# Patient Record
Sex: Male | Born: 1965
Health system: Southern US, Community
[De-identification: ages and names within clinical notes are randomized; demographics above are authoritative.]

## PROBLEM LIST (undated history)

## (undated) DIAGNOSIS — K219 Gastro-esophageal reflux disease without esophagitis: Secondary | ICD-10-CM

## (undated) DIAGNOSIS — I10 Essential (primary) hypertension: Secondary | ICD-10-CM

## (undated) DIAGNOSIS — F419 Anxiety disorder, unspecified: Secondary | ICD-10-CM

## (undated) DIAGNOSIS — R569 Unspecified convulsions: Secondary | ICD-10-CM

## (undated) HISTORY — DX: Gastro-esophageal reflux disease without esophagitis: K21.9

## (undated) HISTORY — DX: Anxiety disorder, unspecified: F41.9

## (undated) HISTORY — DX: Unspecified convulsions: R56.9

---

## 2000-10-18 ENCOUNTER — Emergency Department (HOSPITAL_COMMUNITY): Admission: EM | Admit: 2000-10-18 | Discharge: 2000-10-18 | Payer: Self-pay | Admitting: Emergency Medicine

## 2000-11-22 ENCOUNTER — Emergency Department (HOSPITAL_COMMUNITY): Admission: EM | Admit: 2000-11-22 | Discharge: 2000-11-22 | Payer: Self-pay | Admitting: Emergency Medicine

## 2003-08-12 ENCOUNTER — Emergency Department (HOSPITAL_COMMUNITY): Admission: EM | Admit: 2003-08-12 | Discharge: 2003-08-12 | Payer: Self-pay | Admitting: Emergency Medicine

## 2018-12-17 ENCOUNTER — Emergency Department (HOSPITAL_COMMUNITY)

## 2018-12-17 ENCOUNTER — Other Ambulatory Visit: Payer: Self-pay

## 2018-12-17 ENCOUNTER — Encounter (HOSPITAL_COMMUNITY): Payer: Self-pay | Admitting: Emergency Medicine

## 2018-12-17 ENCOUNTER — Inpatient Hospital Stay (HOSPITAL_COMMUNITY)
Admission: EM | Admit: 2018-12-17 | Discharge: 2018-12-25 | DRG: 178 | Disposition: A | Discharge status: Home / Self Care | Attending: Family Medicine | Admitting: Family Medicine

## 2018-12-17 DIAGNOSIS — Z20828 Contact with and (suspected) exposure to other viral communicable diseases: Secondary | ICD-10-CM | POA: Diagnosis present

## 2018-12-17 DIAGNOSIS — B1920 Unspecified viral hepatitis C without hepatic coma: Secondary | ICD-10-CM | POA: Diagnosis present

## 2018-12-17 DIAGNOSIS — R296 Repeated falls: Secondary | ICD-10-CM | POA: Diagnosis present

## 2018-12-17 DIAGNOSIS — E871 Hypo-osmolality and hyponatremia: Secondary | ICD-10-CM | POA: Diagnosis present

## 2018-12-17 DIAGNOSIS — J852 Abscess of lung without pneumonia: Principal | ICD-10-CM | POA: Diagnosis present

## 2018-12-17 DIAGNOSIS — J984 Other disorders of lung: Secondary | ICD-10-CM

## 2018-12-17 DIAGNOSIS — E876 Hypokalemia: Secondary | ICD-10-CM | POA: Diagnosis present

## 2018-12-17 DIAGNOSIS — R410 Disorientation, unspecified: Secondary | ICD-10-CM | POA: Diagnosis present

## 2018-12-17 DIAGNOSIS — R2681 Unsteadiness on feet: Secondary | ICD-10-CM | POA: Diagnosis present

## 2018-12-17 DIAGNOSIS — I10 Essential (primary) hypertension: Secondary | ICD-10-CM | POA: Diagnosis present

## 2018-12-17 DIAGNOSIS — R945 Abnormal results of liver function studies: Secondary | ICD-10-CM | POA: Diagnosis present

## 2018-12-17 DIAGNOSIS — N179 Acute kidney failure, unspecified: Secondary | ICD-10-CM | POA: Diagnosis present

## 2018-12-17 DIAGNOSIS — F1020 Alcohol dependence, uncomplicated: Secondary | ICD-10-CM | POA: Diagnosis present

## 2018-12-17 HISTORY — DX: Essential (primary) hypertension: I10

## 2018-12-17 LAB — CBC WITH DIFFERENTIAL/PLATELET
Abs Immature Granulocytes: 0.07 10*3/uL (ref 0.00–0.07)
Basophils Absolute: 0.1 10*3/uL (ref 0.0–0.1)
Basophils Relative: 1 %
Eosinophils Absolute: 0.2 10*3/uL (ref 0.0–0.5)
Eosinophils Relative: 2 %
HCT: 32.7 % — ABNORMAL LOW (ref 39.0–52.0)
Hemoglobin: 10.2 g/dL — ABNORMAL LOW (ref 13.0–17.0)
Immature Granulocytes: 1 %
Lymphocytes Relative: 20 %
Lymphs Abs: 2.8 10*3/uL (ref 0.7–4.0)
MCH: 30.8 pg (ref 26.0–34.0)
MCHC: 31.2 g/dL (ref 30.0–36.0)
MCV: 98.8 fL (ref 80.0–100.0)
Monocytes Absolute: 1.3 10*3/uL — ABNORMAL HIGH (ref 0.1–1.0)
Monocytes Relative: 10 %
Neutro Abs: 9.2 10*3/uL — ABNORMAL HIGH (ref 1.7–7.7)
Neutrophils Relative %: 66 %
Platelets: 370 10*3/uL (ref 150–400)
RBC: 3.31 MIL/uL — ABNORMAL LOW (ref 4.22–5.81)
RDW: 13.5 % (ref 11.5–15.5)
WBC: 13.6 10*3/uL — ABNORMAL HIGH (ref 4.0–10.5)
nRBC: 0 % (ref 0.0–0.2)

## 2018-12-17 LAB — AMMONIA: Ammonia: 20 umol/L (ref 9–35)

## 2018-12-17 NOTE — ED Provider Notes (Signed)
[facility] EMERGENCY DEPARTMENT Provider Note   CSN: [id number] Arrival date & time: 12/17/18  1520     History   Chief Complaint Chief Complaint  Patient presents with   Altered Mental Status    HPI Jared Pope is a 53 y.o. male with a history of HTN and etoh abuse,  Presenting from the local [place] jail where he has been since 12/02/18. His presenting paperwork states that he completed etoh withdrawal protocol upon entering their facility but have noticed him to be having increased confusion, frequent falls and being unsteady on his feet.  Pt states he gets dizzy when he stands too fast.  When patient is asked how much he was drinking, he reports 4-5 beers per day and he "detoxed himself" in jail.  He denies chest pain, sob, headache, n/v, abdominal pain.  He also reports decreased appetite and weight loss but is unable to quantify.     HPI  Past Medical History:  Diagnosis Date   Hypertension     There are no active problems to display for this patient.   History reviewed. No pertinent surgical history.      Home Medications    Prior to Admission medications   Not on File    Family History No family history on file.  Social History Social History   Tobacco Use   Smoking status: Never Smoker   Smokeless tobacco: Never Used  Substance Use Topics   Alcohol use: Yes   Drug use: Not Currently     Allergies   Patient has no allergy information on record.   Review of Systems Review of Systems  Constitutional: Negative for chills and fever.  HENT: Negative for congestion.   Eyes: Negative.   Respiratory: Negative for chest tightness and shortness of breath.   Cardiovascular: Negative for chest pain.  Gastrointestinal: Negative for abdominal pain, nausea and vomiting.  Genitourinary: Negative.   Musculoskeletal: Negative for arthralgias, joint swelling and neck pain.  Skin: Negative.  Negative for rash and wound.  Neurological:  Positive for weakness and light-headedness. Negative for dizziness, numbness and headaches.  Psychiatric/Behavioral: Positive for confusion. Negative for agitation.     Physical Exam Updated Vital Signs BP 106/79    Pulse (!) 116    Temp 99.2 F (37.3 C) (Oral)    Resp 16    Ht 5\' 7"  (1.702 m)    Wt 66.7 kg    SpO2 98%    BMI 23.02 kg/m   Physical Exam Vitals signs and nursing note reviewed.  Constitutional:      Appearance: He is well-developed.  HENT:     Head: Normocephalic and atraumatic.     Mouth/Throat:     Mouth: Mucous membranes are moist.  Eyes:     Extraocular Movements: Extraocular movements intact.     Conjunctiva/sclera: Conjunctivae normal.     Pupils: Pupils are equal, round, and reactive to light.  Neck:     Musculoskeletal: Normal range of motion. No neck rigidity.  Cardiovascular:     Rate and Rhythm: Regular rhythm. Tachycardia present.     Heart sounds: Normal heart sounds.  Pulmonary:     Effort: Pulmonary effort is normal.     Breath sounds: Normal breath sounds. No wheezing.  Abdominal:     General: Bowel sounds are normal. There is no distension.     Palpations: Abdomen is soft.     Tenderness: There is no abdominal tenderness. There is no guarding.  Musculoskeletal:  Normal range of motion.  Skin:    General: Skin is warm and dry.  Neurological:     Mental Status: He is alert and oriented to person, place, and time.     Cranial Nerves: Cranial nerves are intact. No cranial nerve deficit.     Sensory: No sensory deficit.     Motor: Tremor present. No seizure activity.     Coordination: Heel to [facility] Test normal. Rapid alternating movements normal.     Comments: Equal grip strength.  RAM completed but slow.      ED Treatments / Results  Labs (all labs ordered are listed, but only abnormal results are displayed) Labs Reviewed  COMPREHENSIVE METABOLIC PANEL - Abnormal; Notable for the following components:      Result Value   Sodium 133 (*)      Chloride 96 (*)    Glucose, Bld 102 (*)    BUN 29 (*)    Creatinine, Ser 1.31 (*)    Total Protein 9.6 (*)    Albumin 3.1 (*)    AST 84 (*)    ALT 48 (*)    Total Bilirubin 1.7 (*)    All other components within normal limits  CBC WITH DIFFERENTIAL/PLATELET - Abnormal; Notable for the following components:   WBC 13.6 (*)    RBC 3.31 (*)    Hemoglobin 10.2 (*)    HCT 32.7 (*)    Neutro Abs 9.2 (*)    Monocytes Absolute 1.3 (*)    All other components within normal limits  AMMONIA  URINALYSIS, ROUTINE W REFLEX MICROSCOPIC  RAPID URINE DRUG SCREEN, HOSP PERFORMED  ETHANOL  TROPONIN I (HIGH SENSITIVITY)  TROPONIN I (HIGH SENSITIVITY)    EKG EKG Interpretation  Date/Time:  Thursday December 18 2018 00:10:04 EDT Ventricular Rate:  99 PR Interval:    QRS Duration: 91 QT Interval:  375 QTC Calculation: 482 R Axis:   73 Text Interpretation:  Normal sinus rhythm Borderline prolonged QT interval Baseline wander in lead(s) V1 Confirmed by Gilda Crease [phone]) on 12/18/2018 12:25:08 AM   Radiology Ct Head Wo Contrast  Result Date: 12/17/2018 CLINICAL DATA:  Generalized muscle weakness. Altered mental status. Dizziness. EXAM: CT HEAD WITHOUT CONTRAST TECHNIQUE: Contiguous axial images were obtained from the base of the skull through the vertex without intravenous contrast. COMPARISON:  Head CT 12/17/2016 FINDINGS: Brain: No evidence of acute infarction, hemorrhage, hydrocephalus, extra-axial collection or mass lesion/mass effect. Mild generalized atrophy. Mild chronic small vessel ischemia. 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 Vascular: No hyperdense vessel or unexpected calcification. Skull: No fracture or focal lesion. Sinuses/Orbits: Paranasal sinuses and mastoid air cells are clear. The visualized orbits are unremarkable. Other: Chronic right parietal scalp scarring. IMPRESSION: 1. No acute intracranial abnormality. 2. Mild  generalized atrophy and chronic small vessel ischemia, slightly advanced for age. Electronically Signed   By: Narda Rutherford M.D.   On: 12/17/2018 23:12    Procedures Procedures (including critical care time)  Medications Ordered in ED Medications  sodium chloride 0.9 % bolus 1,000 mL (has no administration in time range)  thiamine (B-1) injection 100 mg (has no administration in time range)     Initial Impression / Assessment and Plan / ED Course  I have reviewed the triage vital signs and the nursing notes.  Pertinent labs & imaging results that were available during my care of the patient were reviewed by me and considered in my medical decision making (see  chart for details).        Pt who has been incarcerated for 16 days, prior has been a heavy etoh abuser, increased confusion, falls, lightheadedness.  Pending labs at this time, but some resulting labs suggesting dehydration with elevation in creatinine and BUN, IV fluids and thiamine ordered.    Discussed with Dr. Betsey Holiday who will dispo pt when labs completed.   Final Clinical Impressions(s) / ED Diagnoses   Final diagnoses:  None    ED Discharge Orders    None       Landis Martins 12/18/18 0043    Orpah Greek, MD 12/18/18 [phone]

## 2018-12-17 NOTE — ED Triage Notes (Signed)
Pt sent from the jail for ams dizziness and unsteady on his feet.

## 2018-12-18 ENCOUNTER — Inpatient Hospital Stay (HOSPITAL_COMMUNITY)

## 2018-12-18 ENCOUNTER — Emergency Department (HOSPITAL_COMMUNITY)

## 2018-12-18 ENCOUNTER — Encounter (HOSPITAL_COMMUNITY): Payer: Self-pay | Admitting: Internal Medicine

## 2018-12-18 DIAGNOSIS — J984 Other disorders of lung: Secondary | ICD-10-CM

## 2018-12-18 DIAGNOSIS — E871 Hypo-osmolality and hyponatremia: Secondary | ICD-10-CM

## 2018-12-18 DIAGNOSIS — R945 Abnormal results of liver function studies: Secondary | ICD-10-CM | POA: Diagnosis present

## 2018-12-18 DIAGNOSIS — N179 Acute kidney failure, unspecified: Secondary | ICD-10-CM

## 2018-12-18 LAB — TSH: TSH: 5.286 u[IU]/mL — ABNORMAL HIGH (ref 0.350–4.500)

## 2018-12-18 LAB — CBC
HCT: 26.3 % — ABNORMAL LOW (ref 39.0–52.0)
Hemoglobin: 8.4 g/dL — ABNORMAL LOW (ref 13.0–17.0)
MCH: 31.1 pg (ref 26.0–34.0)
MCHC: 31.9 g/dL (ref 30.0–36.0)
MCV: 97.4 fL (ref 80.0–100.0)
Platelets: 350 10*3/uL (ref 150–400)
RBC: 2.7 MIL/uL — ABNORMAL LOW (ref 4.22–5.81)
RDW: 13.4 % (ref 11.5–15.5)
WBC: 14 10*3/uL — ABNORMAL HIGH (ref 4.0–10.5)
nRBC: 0 % (ref 0.0–0.2)

## 2018-12-18 LAB — COMPREHENSIVE METABOLIC PANEL
ALT: 48 U/L — ABNORMAL HIGH (ref 0–44)
ALT: 41 U/L (ref 0–44)
AST: 84 U/L — ABNORMAL HIGH (ref 15–41)
AST: 73 U/L — ABNORMAL HIGH (ref 15–41)
Albumin: 3.1 g/dL — ABNORMAL LOW (ref 3.5–5.0)
Albumin: 2.6 g/dL — ABNORMAL LOW (ref 3.5–5.0)
Alkaline Phosphatase: 98 U/L (ref 38–126)
Alkaline Phosphatase: 91 U/L (ref 38–126)
Anion gap: 12 (ref 5–15)
Anion gap: 9 (ref 5–15)
BUN: 29 mg/dL — ABNORMAL HIGH (ref 6–20)
BUN: 30 mg/dL — ABNORMAL HIGH (ref 6–20)
CO2: 25 mmol/L (ref 22–32)
CO2: 26 mmol/L (ref 22–32)
Calcium: 8.9 mg/dL (ref 8.9–10.3)
Calcium: 8.1 mg/dL — ABNORMAL LOW (ref 8.9–10.3)
Chloride: 96 mmol/L — ABNORMAL LOW (ref 98–111)
Chloride: 98 mmol/L (ref 98–111)
Creatinine, Ser: 1.31 mg/dL — ABNORMAL HIGH (ref 0.61–1.24)
Creatinine, Ser: 1.17 mg/dL (ref 0.61–1.24)
GFR calc Af Amer: >60 mL/min (ref >60)
GFR calc Af Amer: >60 mL/min (ref >60)
GFR calc non Af Amer: >60 mL/min (ref >60)
GFR calc non Af Amer: >60 mL/min (ref >60)
Glucose, Bld: 102 mg/dL — ABNORMAL HIGH (ref 70–99)
Glucose, Bld: 107 mg/dL — ABNORMAL HIGH (ref 70–99)
Potassium: 3.7 mmol/L (ref 3.5–5.1)
Potassium: 3.6 mmol/L (ref 3.5–5.1)
Sodium: 133 mmol/L — ABNORMAL LOW (ref 135–145)
Sodium: 133 mmol/L — ABNORMAL LOW (ref 135–145)
Total Bilirubin: 1.7 mg/dL — ABNORMAL HIGH (ref 0.3–1.2)
Total Bilirubin: 1.4 mg/dL — ABNORMAL HIGH (ref 0.3–1.2)
Total Protein: 9.6 g/dL — ABNORMAL HIGH (ref 6.5–8.1)
Total Protein: 8.1 g/dL (ref 6.5–8.1)

## 2018-12-18 LAB — MAGNESIUM: Magnesium: 1.3 mg/dL — ABNORMAL LOW (ref 1.7–2.4)

## 2018-12-18 LAB — IRON AND TIBC
Iron: 30 ug/dL — ABNORMAL LOW (ref 45–182)
Saturation Ratios: 11 % — ABNORMAL LOW (ref 17.9–39.5)
TIBC: 280 ug/dL (ref 250–450)
UIBC: 250 ug/dL

## 2018-12-18 LAB — RAPID URINE DRUG SCREEN, HOSP PERFORMED
Amphetamines: NOT DETECTED
Barbiturates: NOT DETECTED
Benzodiazepines: POSITIVE — AB
Cocaine: NOT DETECTED
Opiates: NOT DETECTED
Tetrahydrocannabinol: NOT DETECTED

## 2018-12-18 LAB — URINALYSIS, ROUTINE W REFLEX MICROSCOPIC
Bilirubin Urine: NEGATIVE
Glucose, UA: NEGATIVE mg/dL
Hgb urine dipstick: NEGATIVE
Ketones, ur: NEGATIVE mg/dL
Leukocytes,Ua: NEGATIVE
Nitrite: NEGATIVE
Protein, ur: NEGATIVE mg/dL
Specific Gravity, Urine: 1.033 — ABNORMAL HIGH (ref 1.005–1.030)
pH: 6 (ref 5.0–8.0)

## 2018-12-18 LAB — SARS CORONAVIRUS 2 BY RT PCR (HOSPITAL ORDER, PERFORMED IN ~~LOC~~ HOSPITAL LAB): SARS Coronavirus 2: NEGATIVE

## 2018-12-18 LAB — PHOSPHORUS: Phosphorus: 3 mg/dL (ref 2.5–4.6)

## 2018-12-18 LAB — ETHANOL: Alcohol, Ethyl (B): <10 mg/dL (ref <10)

## 2018-12-18 LAB — TROPONIN I (HIGH SENSITIVITY)
Troponin I (High Sensitivity): 5 ng/L (ref <18)
Troponin I (High Sensitivity): 6 ng/L (ref <18)

## 2018-12-18 LAB — VITAMIN B12: Vitamin B-12: 2605 pg/mL — ABNORMAL HIGH (ref 180–914)

## 2018-12-18 LAB — FERRITIN: Ferritin: 590 ng/mL — ABNORMAL HIGH (ref 24–336)

## 2018-12-18 LAB — STREP PNEUMONIAE URINARY ANTIGEN: Strep Pneumo Urinary Antigen: NEGATIVE

## 2018-12-18 MED ORDER — POTASSIUM CHLORIDE CRYS ER 20 MEQ PO TBCR
40.0000 meq | EXTENDED_RELEASE_TABLET | Freq: Once | ORAL | Status: AC
  Administered 2018-12-18: 40 meq via ORAL
  Filled 2018-12-18: qty 2

## 2018-12-18 MED ORDER — VITAMIN B-1 100 MG PO TABS
100.0000 mg | ORAL_TABLET | Freq: Every day | ORAL | Status: DC
  Administered 2018-12-19 – 2018-12-25 (×7): 100 mg via ORAL
  Filled 2018-12-18 (×8): qty 1

## 2018-12-18 MED ORDER — FOLIC ACID 1 MG PO TABS
1.0000 mg | ORAL_TABLET | Freq: Every day | ORAL | Status: DC
  Administered 2018-12-18 – 2018-12-25 (×8): 1 mg via ORAL
  Filled 2018-12-18 (×8): qty 1

## 2018-12-18 MED ORDER — LORAZEPAM 1 MG PO TABS: 1.0000 mg | ORAL_TABLET | ORAL | Status: AC | PRN

## 2018-12-18 MED ORDER — THIAMINE HCL 100 MG/ML IJ SOLN
100.0000 mg | Freq: Every day | INTRAMUSCULAR | Status: DC
  Administered 2018-12-18: 100 mg via INTRAVENOUS
  Filled 2018-12-18 (×2): qty 2

## 2018-12-18 MED ORDER — ACETAMINOPHEN 650 MG RE SUPP: 650.0000 mg | Freq: Four times a day (QID) | RECTAL | Status: DC | PRN

## 2018-12-18 MED ORDER — LORAZEPAM 2 MG/ML IJ SOLN: 1.0000 mg | INTRAMUSCULAR | Status: AC | PRN

## 2018-12-18 MED ORDER — SODIUM CHLORIDE 0.9 % IV SOLN
INTRAVENOUS | Status: AC
  Administered 2018-12-18: 06:00:00 via INTRAVENOUS

## 2018-12-18 MED ORDER — IOHEXOL 300 MG/ML  SOLN
75.0000 mL | Freq: Once | INTRAMUSCULAR | Status: AC | PRN
  Administered 2018-12-18: 75 mL via INTRAVENOUS

## 2018-12-18 MED ORDER — LORAZEPAM 2 MG/ML IJ SOLN: 0.0000 mg | Freq: Four times a day (QID) | INTRAMUSCULAR | Status: AC

## 2018-12-18 MED ORDER — THIAMINE HCL 100 MG/ML IJ SOLN
100.0000 mg | Freq: Once | INTRAMUSCULAR | Status: AC
  Administered 2018-12-18: 100 mg via INTRAVENOUS
  Filled 2018-12-18: qty 2

## 2018-12-18 MED ORDER — SODIUM CHLORIDE 0.9 % IV BOLUS
1000.0000 mL | Freq: Once | INTRAVENOUS | Status: AC
  Administered 2018-12-18: 1000 mL via INTRAVENOUS

## 2018-12-18 MED ORDER — ENOXAPARIN SODIUM 40 MG/0.4ML ~~LOC~~ SOLN
40.0000 mg | SUBCUTANEOUS | Status: DC
  Administered 2018-12-18 – 2018-12-25 (×8): 40 mg via SUBCUTANEOUS
  Filled 2018-12-18 (×9): qty 0.4

## 2018-12-18 MED ORDER — PRO-STAT SUGAR FREE PO LIQD
30.0000 mL | Freq: Two times a day (BID) | ORAL | Status: DC
  Administered 2018-12-18 – 2018-12-25 (×14): 30 mL via ORAL
  Filled 2018-12-18 (×18): qty 30

## 2018-12-18 MED ORDER — ACETAMINOPHEN 325 MG PO TABS
650.0000 mg | ORAL_TABLET | Freq: Four times a day (QID) | ORAL | Status: DC | PRN
  Administered 2018-12-18 – 2018-12-20 (×3): 650 mg via ORAL
  Filled 2018-12-18 (×4): qty 2

## 2018-12-18 MED ORDER — SODIUM CHLORIDE 0.9 % IV SOLN
3.0000 g | Freq: Four times a day (QID) | INTRAVENOUS | Status: DC
  Administered 2018-12-18 – 2018-12-23 (×21): 3 g via INTRAVENOUS
  Filled 2018-12-18: qty 8
  Filled 2018-12-18 (×3): qty 3
  Filled 2018-12-18: qty 8
  Filled 2018-12-18 (×2): qty 3
  Filled 2018-12-18 (×11): qty 8
  Filled 2018-12-18: qty 3
  Filled 2018-12-18: qty 8
  Filled 2018-12-18: qty 3
  Filled 2018-12-18 (×6): qty 8

## 2018-12-18 MED ORDER — MAGNESIUM SULFATE 2 GM/50ML IV SOLN
2.0000 g | Freq: Once | INTRAVENOUS | Status: AC
  Administered 2018-12-18: 2 g via INTRAVENOUS
  Filled 2018-12-18: qty 50

## 2018-12-18 MED ORDER — ADULT MULTIVITAMIN W/MINERALS CH
1.0000 | ORAL_TABLET | Freq: Every day | ORAL | Status: DC
  Administered 2018-12-18 – 2018-12-25 (×8): 1 via ORAL
  Filled 2018-12-18 (×8): qty 1

## 2018-12-18 MED ORDER — LORAZEPAM 2 MG/ML IJ SOLN: 0.0000 mg | Freq: Two times a day (BID) | INTRAMUSCULAR | Status: AC

## 2018-12-18 NOTE — ED Notes (Signed)
Patient given lunch tray, afternoon meds, placed back on monitor.

## 2018-12-18 NOTE — Consult Note (Signed)
Consult requested by: [facility] hospitalist, Dr.Ghimire Consult requested for: Cavitary lung lesion  HPI: This is a 53 year old who was brought from the [facility] jail where he has been since September 8.  He is apparently had confusion frequent falls been unsteady on his feet.  He apparently was sent to the emergency department for questionable detox.  He has significant history of alcohol abuse.  He is not able to provide any significant history.  But does say that he had a cough as part of his work-up he had a CT of the brain and had CT of the chest and CT of the chest shows a cavitary lesion in the left upper lobe.  I have personally reviewed this scan.  Past Medical History:  Diagnosis Date  . Hypertension      Family History  Family history unknown: Yes    Unable to obtain Social History   Socioeconomic History  . Marital status: Single    Spouse name: Not on file  . Number of children: Not on file  . Years of education: Not on file  . Highest education level: Not on file  Occupational History  . Not on file  Social Needs  . Financial resource strain: Not on file  . Food insecurity    Worry: Not on file    Inability: Not on file  . Transportation needs    Medical: Not on file    Non-medical: Not on file  Tobacco Use  . Smoking status: Never Smoker  . Smokeless tobacco: Never Used  Substance and Sexual Activity  . Alcohol use: Not Currently  . Drug use: Not Currently  . Sexual activity: Not on file  Lifestyle  . Physical activity    Days per week: Not on file    Minutes per session: Not on file  . Stress: Not on file  Relationships  . Social Musician on phone: Not on file    Gets together: Not on file    Attends religious service: Not on file    Active member of club or organization: Not on file    Attends meetings of clubs or organizations: Not on file    Relationship status: Not on file  Other Topics Concern  . Not on file  Social History  Narrative  . Not on file     ROS: Unable to obtain    Objective: Vital signs in last 24 hours: Temp:  [99.2 F (37.3 C)] 99.2 F (37.3 C) (09/23 1526) Pulse Rate:  [89-116] 91 (09/24 0700) Resp:  [16-28] 28 (09/24 0230) BP: (96-106)/(58-79) 98/58 (09/24 0700) SpO2:  [97 %-100 %] 99 % (09/24 0700) Weight:  [66.7 kg] 66.7 kg (09/23 1526) Weight change:     Intake/Output from previous day: No intake/output data recorded.  PHYSICAL EXAM Constitutional: He is thin.  Eyes: Pupils react ears nose mouth and throat: Mucous membranes are dry.  Cardiovascular: His heart is regular with normal heart sounds.  Respiratory: Respiratory effort is normal.  His lungs are pretty clear.  Gastrointestinal: His abdomen is soft with no masses.  Musculoskeletal: Grossly normal strength bilaterally.  Psychiatric: Sluggish.  Neurological: No focal abnormalities.  Lab Results: Basic Metabolic Panel: Recent Labs    12/17/18 2303 12/18/18 0552  NA 133* 133*  K 3.7 3.6  CL 96* 98  CO2 25 26  GLUCOSE 102* 107*  BUN 29* 30*  CREATININE 1.31* 1.17  CALCIUM 8.9 8.1*  MG  --  1.3*  PHOS  --  3.0   Liver Function Tests: Recent Labs    12/17/18 2303 12/18/18 0552  AST 84* 73*  ALT 48* 41  ALKPHOS 98 91  BILITOT 1.7* 1.4*  PROT 9.6* 8.1  ALBUMIN 3.1* 2.6*   No results for input(s): LIPASE, AMYLASE in the last 72 hours. Recent Labs    12/17/18 2303  AMMONIA 20   CBC: Recent Labs    12/17/18 2303 12/18/18 0552  WBC 13.6* 14.0*  NEUTROABS 9.2*  --   HGB 10.2* 8.4*  HCT 32.7* 26.3*  MCV 98.8 97.4  PLT 370 350   Cardiac Enzymes: No results for input(s): CKTOTAL, CKMB, CKMBINDEX, TROPONINI in the last 72 hours. BNP: No results for input(s): PROBNP in the last 72 hours. D-Dimer: No results for input(s): DDIMER in the last 72 hours. CBG: No results for input(s): GLUCAP in the last 72 hours. Hemoglobin A1C: No results for input(s): HGBA1C in the last 72 hours. Fasting Lipid  Panel: No results for input(s): CHOL, HDL, LDLCALC, TRIG, CHOLHDL, LDLDIRECT in the last 72 hours. Thyroid Function Tests: Recent Labs    12/17/18 2303  TSH 5.286*   Anemia Panel: Recent Labs    12/18/18 0549  VITAMINB12 2,605*  FERRITIN 590*  TIBC 280  IRON 30*   Coagulation: No results for input(s): LABPROT, INR in the last 72 hours. Urine Drug Screen: Drugs of Abuse     Component Value Date/Time   LABOPIA NONE DETECTED 12/18/2018 0359   COCAINSCRNUR NONE DETECTED 12/18/2018 0359   LABBENZ POSITIVE (A) 12/18/2018 0359   AMPHETMU NONE DETECTED 12/18/2018 0359   THCU NONE DETECTED 12/18/2018 0359   LABBARB NONE DETECTED 12/18/2018 0359    Alcohol Level: Recent Labs    12/17/18 2303  ETH <10   Urinalysis: Recent Labs    12/18/18 0359  COLORURINE AMBER*  LABSPEC 1.033*  PHURINE 6.0  GLUCOSEU NEGATIVE  HGBUR NEGATIVE  BILIRUBINUR NEGATIVE  KETONESUR NEGATIVE  PROTEINUR NEGATIVE  NITRITE NEGATIVE  LEUKOCYTESUR NEGATIVE   Misc. Labs:   ABGS: No results for input(s): PHART, PO2ART, TCO2, HCO3 in the last 72 hours.  Invalid input(s): PCO2   MICROBIOLOGY: Recent Results (from the past 240 hour(s))  Culture, blood (routine x 2)     Status: None (Preliminary result)   Collection Time: 12/18/18  5:49 AM   Specimen: BLOOD RIGHT ARM  Result Value Ref Range Status   Specimen Description BLOOD RIGHT ARM  Final   Special Requests   Final    BOTTLES DRAWN AEROBIC AND ANAEROBIC Blood Culture adequate volume   Culture   Final    NO GROWTH <12 HOURS Performed at [facility], [address] [place], [place] [address]    Report Status PENDING  Incomplete  Culture, blood (routine x 2)     Status: None (Preliminary result)   Collection Time: 12/18/18  5:52 AM   Specimen: BLOOD RIGHT HAND  Result Value Ref Range Status   Specimen Description BLOOD RIGHT HAND  Final   Special Requests   Final    BOTTLES DRAWN AEROBIC AND ANAEROBIC Blood Culture adequate volume    Culture   Final    NO GROWTH <12 HOURS Performed at [facility], [address], [place], [place] [address]    Report Status PENDING  Incomplete  SARS Coronavirus 2 [facility] order, Performed in [facility] hospital lab) Nasopharyngeal Nasopharyngeal Swab     Status: None   Collection Time: 12/18/18  6:21 AM   Specimen:  Nasopharyngeal Swab  Result Value Ref Range Status   SARS Coronavirus 2 NEGATIVE NEGATIVE Final    Comment: (NOTE) If result is NEGATIVE SARS-CoV-2 target nucleic acids are NOT DETECTED. The SARS-CoV-2 RNA is generally detectable in upper and lower  respiratory specimens during the acute phase of infection. The lowest  concentration of SARS-CoV-2 viral copies this assay can detect is 250  copies / mL. A negative result does not preclude SARS-CoV-2 infection  and should not be used as the sole basis for treatment or other  patient management decisions.  A negative result may occur with  improper specimen collection / handling, submission of specimen other  than nasopharyngeal swab, presence of viral mutation(s) within the  areas targeted by this assay, and inadequate number of viral copies  (<250 copies / mL). A negative result must be combined with clinical  observations, patient history, and epidemiological information. If result is POSITIVE SARS-CoV-2 target nucleic acids are DETECTED. The SARS-CoV-2 RNA is generally detectable in upper and lower  respiratory specimens dur ing the acute phase of infection.  Positive  results are indicative of active infection with SARS-CoV-2.  Clinical  correlation with patient history and other diagnostic information is  necessary to determine patient infection status.  Positive results do  not rule out bacterial infection or co-infection with other viruses. If result is PRESUMPTIVE POSTIVE SARS-CoV-2 nucleic acids MAY BE PRESENT.   A presumptive positive result was obtained on the submitted specimen  and confirmed on repeat  testing.  While 2019 novel coronavirus  (SARS-CoV-2) nucleic acids may be present in the submitted sample  additional confirmatory testing may be necessary for epidemiological  and / or clinical management purposes  to differentiate between  SARS-CoV-2 and other Sarbecovirus currently known to infect humans.  If clinically indicated additional testing with an alternate test  methodology [phone]) is advised. The SARS-CoV-2 RNA is generally  detectable in upper and lower respiratory sp ecimens during the acute  phase of infection. The expected result is Negative. Fact Sheet for Patients:  BoilerBrush.com.cy Fact Sheet for Healthcare Providers: https://pope.com/ This test is not yet approved or cleared by the [place] FDA and has been authorized for detection and/or diagnosis of SARS-CoV-2 by FDA under an Emergency Use Authorization (EUA).  This EUA will remain in effect (meaning this test can be used) for the duration of the COVID-19 declaration under Section 564(b)(1) of the Act, 21 U.S.C. section 360bbb-3(b)(1), unless the authorization is terminated or revoked sooner. Performed at [facility], [address], [place], [place] [address]     Studies/Results: Dg Chest 2 View  Result Date: 12/18/2018 CLINICAL DATA:  Altered level of consciousness. EXAM: CHEST - 2 VIEW COMPARISON:  None. FINDINGS: Right upper lobe airspace opacity with central lucencies. Left lung is clear. Heart is normal in size. Slight right hilar prominence. No pulmonary edema, pleural fluid, or pneumothorax. No acute osseous abnormalities are seen. IMPRESSION: Right upper lobe opacity with central lucencies, probable cavitary process, infection versus malignancy. Recommend further evaluation with chest CT, preferably with IV contrast. Electronically Signed   By: Narda Rutherford M.D.   On: 12/18/2018 01:59   Ct Head Wo Contrast  Result Date: 12/17/2018 CLINICAL  DATA:  Generalized muscle weakness. Altered mental status. Dizziness. EXAM: CT HEAD WITHOUT CONTRAST TECHNIQUE: Contiguous axial images were obtained from the base of the skull through the vertex without intravenous contrast. COMPARISON:  Head CT 12/17/2016 FINDINGS: Brain: No evidence of acute infarction, hemorrhage, hydrocephalus, extra-axial collection or  mass lesion/mass effect. Mild generalized atrophy. Mild chronic small vessel ischemia. 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 Vascular: No hyperdense vessel or unexpected calcification. Skull: No fracture or focal lesion. Sinuses/Orbits: Paranasal sinuses and mastoid air cells are clear. The visualized orbits are unremarkable. Other: Chronic right parietal scalp scarring. IMPRESSION: 1. No acute intracranial abnormality. 2. Mild generalized atrophy and chronic small vessel ischemia, slightly advanced for age. Electronically Signed   By: Narda RutherfordMelanie  Sanford M.D.   On: 12/17/2018 23:12   Ct Chest W Contrast  Result Date: 12/18/2018 CLINICAL DATA:  53 year old male with dizziness. EXAM: CT CHEST WITH CONTRAST TECHNIQUE: Multidetector CT imaging of the chest was performed during intravenous contrast administration. CONTRAST:  75mL OMNIPAQUE IOHEXOL 300 MG/ML  SOLN COMPARISON:  Chest radiograph dated 12/18/2018 FINDINGS: Cardiovascular: There is no cardiomegaly or pericardial effusion. The thoracic aorta is unremarkable. The origins of the great vessels of the aortic arch appear patent. The central pulmonary arteries are grossly unremarkable for the degree of opacification. Mediastinum/Nodes: No hilar or mediastinal adenopathy. The esophagus and the thyroid gland are grossly unremarkable. No mediastinal fluid collection. Lungs/Pleura: There is a 3.3 x 3.0 cm cavitary lesion in the right upper lobe with ground-glass and nodular density in the adjacent lung parenchyma. Findings may represent fungal infection, abscess, TB.  Malignancy is not excluded. Clinical correlation and follow-up to resolution after treatment is recommended. The left lung is clear. Upper Abdomen: Fatty infiltration of the liver. The visualized upper abdomen is otherwise unremarkable. Musculoskeletal: No chest wall abnormality. No acute or significant osseous findings. IMPRESSION: Cavitary lesion in the right upper lobe with ground-glass and nodular density in the adjacent lung parenchyma. Findings may represent fungal infection, abscess, TB. Malignancy is not excluded. Clinical correlation and follow-up to resolution after treatment is recommended. Electronically Signed   By: Elgie CollardArash  Radparvar M.D.   On: 12/18/2018 03:21    Medications:  Prior to Admission: (Not in a hospital admission)  Scheduled: . enoxaparin (LOVENOX) injection  40 mg Subcutaneous Q24H  . feeding supplement (PRO-STAT SUGAR FREE 64)  30 mL Oral BID  . folic acid  1 mg Oral Daily  . LORazepam  0-4 mg Intravenous Q6H   Followed by  . [START ON 12/20/2018] LORazepam  0-4 mg Intravenous Q12H  . multivitamin with minerals  1 tablet Oral Daily  . thiamine  100 mg Oral Daily   Or  . thiamine  100 mg Intravenous Daily   Continuous: . sodium chloride 75 mL/hr at 12/18/18 0613  . ampicillin-sulbactam (UNASYN) IV 3 g (12/18/18 0612)   ZOX:[id number]:acetaminophen **OR** acetaminophen, LORazepam **OR** LORazepam  Assesment: He is admitted with a cavitary lesion of the lung.  Although this could be lung abscess it could be tuberculosis and he needs to be maintained in a negative pressure room and check AFB x3.  Agree with Unasyn.  It is not clear to me exactly what his alcohol abuse history is but likely if he has significant alcohol abuse he could have aspirated and developed a lung abscess.    I think he is probably dehydrated and agree with IV fluids  He has abnormal liver function testing he is going to have an ultrasound Principal Problem:   Cavitary lesion of lung Active Problems:    Abnormal liver function   Hyponatremia   ARF (acute renal failure) (HCC)    Plan: Continue current treatments.  Thanks for allowing me to see him with you    LOS:  0 days   Alonza Bogus 12/18/2018, 8:17 AM

## 2018-12-18 NOTE — Progress Notes (Addendum)
PROGRESS NOTE        PATIENT DETAILS Name: Jared Pope Age: 53 y.o. Sex: male Date of Birth: [birth year] Admit Date: 12/17/2018 Admitting Physician No admitting provider for patient encounter. ZOX:[id number], [name]  Brief Narrative: Patient is a 53 y.o. male with history of alcohol abuse, HTN-currently incarcerated at a local prison for almost 2 weeks-presented to the hospital for weakness and dizziness, further evaluation revealed a right upper lobe lung cavitary lesion.  Subsequently admitted for further evaluation and treatment.  Subjective: No further dizziness-he feels "fine".  Denies any recent low-grade fever-some mild dry cough.  Claims he has lost approximately 10 to 15 pounds over the past few years.  Assessment/Plan: Right upper lung cavitary lesion: I suspect this is probably lung abscess-in a setting of alcohol use and very poor dentition.  However he has all the risk factors for tuberculosis.  Maintain negative/airborne isolation-await sputum AFB x3.  In the meantime, continue with Unasyn for presumed lung abscess.   History of alcohol abuse: Has been incarcerated for almost 2 weeks-no current symptoms consistent with alcohol withdrawal.  I suspect he is out of the window for alcohol withdrawal.  AKI: Mild-resolved with supportive care.  Hypokalemia/hypomagnesemia: Replete and recheck.  Diet: Diet Order            Diet Heart Room service appropriate? Yes; Fluid consistency: Thin  Diet effective now               DVT Prophylaxis: Prophylactic Lovenox   Code Status: Full code   Family Communication: None at bedside  Disposition Plan: Remain inpatient  Barriers for discharge: Pulmonary tuberculosis will need to be ruled out-awaiting sputum AFB smear x3.  Antimicrobial agents: Anti-infectives (From admission, onward)   Start     Dose/Rate Route Frequency Ordered Stop   12/18/18 0530  Ampicillin-Sulbactam (UNASYN) 3 g in  sodium chloride 0.9 % 100 mL IVPB     3 g 200 mL/hr over 30 Minutes Intravenous Every 6 hours 12/18/18 0508        Procedures: None  CONSULTS:  pulmonary/intensive care  Time spent: 25- minutes-Greater than 50% of this time was spent in counseling, explanation of diagnosis, planning of further management, and coordination of care.  MEDICATIONS: Scheduled Meds: . enoxaparin (LOVENOX) injection  40 mg Subcutaneous Q24H  . feeding supplement (PRO-STAT SUGAR FREE 64)  30 mL Oral BID  . folic acid  1 mg Oral Daily  . LORazepam  0-4 mg Intravenous Q6H   Followed by  . [START ON 12/20/2018] LORazepam  0-4 mg Intravenous Q12H  . multivitamin with minerals  1 tablet Oral Daily  . thiamine  100 mg Oral Daily   Or  . thiamine  100 mg Intravenous Daily   Continuous Infusions: . sodium chloride 75 mL/hr at 12/18/18 0613  . ampicillin-sulbactam (UNASYN) IV 3 g (12/18/18 1331)   PRN Meds:.acetaminophen **OR** acetaminophen, LORazepam **OR** LORazepam   PHYSICAL EXAM: Vital signs: Vitals:   12/18/18 0700 12/18/18 1321 12/18/18 1329 12/18/18 1330  BP: (!) 98/58 114/80 114/80 111/79  Pulse: 91  95 91  Resp:  (!) 21  (!) 21  Temp:      TempSrc:      SpO2: 99%   100%  Weight:      Height:       Filed Weights   12/17/18  1526  Weight: 66.7 kg   Body mass index is 23.02 kg/m.   Gen Exam:Alert awake-not in any distress HEENT:atraumatic, normocephalic Chest: B/L clear to auscultation anteriorly CVS:S1S2 regular Abdomen:soft non tender, non distended Extremities:no edema Neurology: Non focal Skin: no rash  I have personally reviewed following labs and imaging studies  LABORATORY DATA: CBC: Recent Labs  Lab 12/17/18 2303 12/18/18 0552  WBC 13.6* 14.0*  NEUTROABS 9.2*  --   HGB 10.2* 8.4*  HCT 32.7* 26.3*  MCV 98.8 97.4  PLT 370 017    Basic Metabolic Panel: Recent Labs  Lab 12/17/18 2303 12/18/18 0552  NA 133* 133*  K 3.7 3.6  CL 96* 98  CO2 25 26  GLUCOSE  102* 107*  BUN 29* 30*  CREATININE 1.31* 1.17  CALCIUM 8.9 8.1*  MG  --  1.3*  PHOS  --  3.0    GFR: Estimated Creatinine Clearance: 66.7 mL/min (by C-G formula based on SCr of 1.17 mg/dL).  Liver Function Tests: Recent Labs  Lab 12/17/18 2303 12/18/18 0552  AST 84* 73*  ALT 48* 41  ALKPHOS 98 91  BILITOT 1.7* 1.4*  PROT 9.6* 8.1  ALBUMIN 3.1* 2.6*   No results for input(s): LIPASE, AMYLASE in the last 168 hours. Recent Labs  Lab 12/17/18 2303  AMMONIA 20    Coagulation Profile: No results for input(s): INR, PROTIME in the last 168 hours.  Cardiac Enzymes: No results for input(s): CKTOTAL, CKMB, CKMBINDEX, TROPONINI in the last 168 hours.  BNP (last 3 results) No results for input(s): PROBNP in the last 8760 hours.  HbA1C: No results for input(s): HGBA1C in the last 72 hours.  CBG: No results for input(s): GLUCAP in the last 168 hours.  Lipid Profile: No results for input(s): CHOL, HDL, LDLCALC, TRIG, CHOLHDL, LDLDIRECT in the last 72 hours.  Thyroid Function Tests: Recent Labs    12/17/18 2303  TSH 5.286*    Anemia Panel: Recent Labs    12/18/18 0549  VITAMINB12 2,605*  FERRITIN 590*  TIBC 280  IRON 30*    Urine analysis:    Component Value Date/Time   COLORURINE AMBER (A) 12/18/2018 0359   APPEARANCEUR CLEAR 12/18/2018 0359   LABSPEC 1.033 (H) 12/18/2018 0359   PHURINE 6.0 12/18/2018 0359   GLUCOSEU NEGATIVE 12/18/2018 0359   HGBUR NEGATIVE 12/18/2018 0359   BILIRUBINUR NEGATIVE 12/18/2018 0359   KETONESUR NEGATIVE 12/18/2018 0359   PROTEINUR NEGATIVE 12/18/2018 0359   NITRITE NEGATIVE 12/18/2018 0359   LEUKOCYTESUR NEGATIVE 12/18/2018 0359    Sepsis Labs: Lactic Acid, Venous No results found for: LATICACIDVEN  MICROBIOLOGY: Recent Results (from the past 240 hour(s))  Culture, blood (routine x 2)     Status: None (Preliminary result)   Collection Time: 12/18/18  5:49 AM   Specimen: BLOOD RIGHT ARM  Result Value Ref Range  Status   Specimen Description BLOOD RIGHT ARM  Final   Special Requests   Final    BOTTLES DRAWN AEROBIC AND ANAEROBIC Blood Culture adequate volume   Culture   Final    NO GROWTH <12 HOURS Performed at [facility], [address], [place], [place] [address]    Report Status PENDING  Incomplete  Culture, blood (routine x 2)     Status: None (Preliminary result)   Collection Time: 12/18/18  5:52 AM   Specimen: BLOOD RIGHT ARM  Result Value Ref Range Status   Specimen Description BLOOD RIGHT HAND  Final   Special Requests   Final  BOTTLES DRAWN AEROBIC AND ANAEROBIC Blood Culture adequate volume   Culture   Final    NO GROWTH <12 HOURS Performed at [facility], [address], [place], [place] [address]    Report Status PENDING  Incomplete  SARS Coronavirus 2 [facility] order, Performed in [facility] hospital lab) Nasopharyngeal Nasopharyngeal Swab     Status: None   Collection Time: 12/18/18  6:21 AM   Specimen: Nasopharyngeal Swab  Result Value Ref Range Status   SARS Coronavirus 2 NEGATIVE NEGATIVE Final    Comment: (NOTE) If result is NEGATIVE SARS-CoV-2 target nucleic acids are NOT DETECTED. The SARS-CoV-2 RNA is generally detectable in upper and lower  respiratory specimens during the acute phase of infection. The lowest  concentration of SARS-CoV-2 viral copies this assay can detect is 250  copies / mL. A negative result does not preclude SARS-CoV-2 infection  and should not be used as the sole basis for treatment or other  patient management decisions.  A negative result may occur with  improper specimen collection / handling, submission of specimen other  than nasopharyngeal swab, presence of viral mutation(s) within the  areas targeted by this assay, and inadequate number of viral copies  (<250 copies / mL). A negative result must be combined with clinical  observations, patient history, and epidemiological information. If result is POSITIVE SARS-CoV-2 target  nucleic acids are DETECTED. The SARS-CoV-2 RNA is generally detectable in upper and lower  respiratory specimens dur ing the acute phase of infection.  Positive  results are indicative of active infection with SARS-CoV-2.  Clinical  correlation with patient history and other diagnostic information is  necessary to determine patient infection status.  Positive results do  not rule out bacterial infection or co-infection with other viruses. If result is PRESUMPTIVE POSTIVE SARS-CoV-2 nucleic acids MAY BE PRESENT.   A presumptive positive result was obtained on the submitted specimen  and confirmed on repeat testing.  While 2019 novel coronavirus  (SARS-CoV-2) nucleic acids may be present in the submitted sample  additional confirmatory testing may be necessary for epidemiological  and / or clinical management purposes  to differentiate between  SARS-CoV-2 and other Sarbecovirus currently known to infect humans.  If clinically indicated additional testing with an alternate test  methodology [phone]) is advised. The SARS-CoV-2 RNA is generally  detectable in upper and lower respiratory sp ecimens during the acute  phase of infection. The expected result is Negative. Fact Sheet for Patients:  BoilerBrush.com.cy Fact Sheet for Healthcare Providers: https://pope.com/ This test is not yet approved or cleared by the [place] FDA and has been authorized for detection and/or diagnosis of SARS-CoV-2 by FDA under an Emergency Use Authorization (EUA).  This EUA will remain in effect (meaning this test can be used) for the duration of the COVID-19 declaration under Section 564(b)(1) of the Act, 21 U.S.C. section 360bbb-3(b)(1), unless the authorization is terminated or revoked sooner. Performed at [facility], [address] [place], [place] [address]     RADIOLOGY STUDIES/RESULTS: Dg Chest 2 View  Result Date: 12/18/2018 CLINICAL DATA:   Altered level of consciousness. EXAM: CHEST - 2 VIEW COMPARISON:  None. FINDINGS: Right upper lobe airspace opacity with central lucencies. Left lung is clear. Heart is normal in size. Slight right hilar prominence. No pulmonary edema, pleural fluid, or pneumothorax. No acute osseous abnormalities are seen. IMPRESSION: Right upper lobe opacity with central lucencies, probable cavitary process, infection versus malignancy. Recommend further evaluation with chest CT, preferably with IV contrast.  Electronically Signed   By: Narda RutherfordMelanie  Sanford M.D.   On: 12/18/2018 01:59   Ct Head Wo Contrast  Result Date: 12/17/2018 CLINICAL DATA:  Generalized muscle weakness. Altered mental status. Dizziness. EXAM: CT HEAD WITHOUT CONTRAST TECHNIQUE: Contiguous axial images were obtained from the base of the skull through the vertex without intravenous contrast. COMPARISON:  Head CT 12/17/2016 FINDINGS: Brain: No evidence of acute infarction, hemorrhage, hydrocephalus, extra-axial collection or mass lesion/mass effect. Mild generalized atrophy. Mild chronic small vessel ischemia. 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 Vascular: No hyperdense vessel or unexpected calcification. Skull: No fracture or focal lesion. Sinuses/Orbits: Paranasal sinuses and mastoid air cells are clear. The visualized orbits are unremarkable. Other: Chronic right parietal scalp scarring. IMPRESSION: 1. No acute intracranial abnormality. 2. Mild generalized atrophy and chronic small vessel ischemia, slightly advanced for age. Electronically Signed   By: Narda RutherfordMelanie  Sanford M.D.   On: 12/17/2018 23:12   Ct Chest W Contrast  Result Date: 12/18/2018 CLINICAL DATA:  53 year old male with dizziness. EXAM: CT CHEST WITH CONTRAST TECHNIQUE: Multidetector CT imaging of the chest was performed during intravenous contrast administration. CONTRAST:  75mL OMNIPAQUE IOHEXOL 300 MG/ML  SOLN COMPARISON:  Chest radiograph dated  12/18/2018 FINDINGS: Cardiovascular: There is no cardiomegaly or pericardial effusion. The thoracic aorta is unremarkable. The origins of the great vessels of the aortic arch appear patent. The central pulmonary arteries are grossly unremarkable for the degree of opacification. Mediastinum/Nodes: No hilar or mediastinal adenopathy. The esophagus and the thyroid gland are grossly unremarkable. No mediastinal fluid collection. Lungs/Pleura: There is a 3.3 x 3.0 cm cavitary lesion in the right upper lobe with ground-glass and nodular density in the adjacent lung parenchyma. Findings may represent fungal infection, abscess, TB. Malignancy is not excluded. Clinical correlation and follow-up to resolution after treatment is recommended. The left lung is clear. Upper Abdomen: Fatty infiltration of the liver. The visualized upper abdomen is otherwise unremarkable. Musculoskeletal: No chest wall abnormality. No acute or significant osseous findings. IMPRESSION: Cavitary lesion in the right upper lobe with ground-glass and nodular density in the adjacent lung parenchyma. Findings may represent fungal infection, abscess, TB. Malignancy is not excluded. Clinical correlation and follow-up to resolution after treatment is recommended. Electronically Signed   By: Elgie CollardArash  Radparvar M.D.   On: 12/18/2018 03:21   [place] Abdomen Limited Ruq  Result Date: 12/18/2018 CLINICAL DATA:  Abnormal LFTs EXAM: ULTRASOUND ABDOMEN LIMITED RIGHT UPPER QUADRANT COMPARISON:  None Correlation CT chest 12/18/2018 FINDINGS: Gallbladder: Incompletely distended. Gallbladder wall appears thickened. No shadowing calculi or pericholecystic fluid. No sonographic Murphy sign. Common bile duct: Diameter: 6 mm, upper normal Liver: Echogenic parenchyma, likely fatty infiltration though this can be seen with cirrhosis and certain infiltrative disorders. 7 x 6 x 11 mm hyperechoic focus within RIGHT lobe of liver without shadowing nonspecific but question tiny  hemangioma. This is questionably visualized as a hypoechoic nodule on the CT chest exam series 2, image 120. Portal vein is patent on color Doppler imaging with normal direction of blood flow towards the liver. Other: No RIGHT upper quadrant free fluid. IMPRESSION: Nonspecific mild wall thickening of the gallbladder without evidence of gallstones or pericholecystic fluid. Probable mild fatty infiltration of liver. Question 11 mm hemangioma RIGHT lobe liver, questionably visualized as slightly low-attenuation nodule on CT. Electronically Signed   By: Ulyses SouthwardMark  Boles M.D.   On: 12/18/2018 10:47     LOS: 0 days   Jeoffrey MassedShanker Jarmarcus Wambold, MD  [facility]  If 7PM-7AM, please contact night-coverage  Please page via [email or web address]  Go to [email or web address] and use [facility]'s universal password to access. If you do not have the password, please contact the hospital operator.  Locate the [facility] provider you are looking for under [facility] and page to a number that you can be directly reached. If you still have difficulty reaching the provider, please page the [facility] (Director on Call) for the Hospitalists listed on amion for assistance.  12/18/2018, 1:41 PM

## 2018-12-18 NOTE — Clinical Social Work Note (Signed)
Attempted to call patient's room phone to interview patient in response to consult re: alcohol use.  No answer.  Will attempt again later.

## 2018-12-18 NOTE — Progress Notes (Signed)
ANTIBIOTIC CONSULT NOTE-Preliminary  Pharmacy Consult for Unasyn Indication: lung abscess  Not on File  Patient Measurements: Height: 5\' 7"  (170.2 cm) Weight: 147 lb (66.7 kg) IBW/kg (Calculated) : 66.1 Adjusted Body Weight:  Vital Signs: BP: 98/67 (09/24 0545) Pulse Rate: 96 (09/24 0545)  Labs: Recent Labs    12/17/18 2303 12/18/18 0552  WBC 13.6* 14.0*  HGB 10.2* 8.4*  PLT 370 350  CREATININE 1.31* 1.17    Estimated Creatinine Clearance: 66.7 mL/min (by C-G formula based on SCr of 1.17 mg/dL).  No results for input(s): VANCOTROUGH, VANCOPEAK, VANCORANDOM, GENTTROUGH, GENTPEAK, GENTRANDOM, TOBRATROUGH, TOBRAPEAK, TOBRARND, AMIKACINPEAK, AMIKACINTROU, AMIKACIN in the last 72 hours.   Microbiology: Recent Results (from the past 720 hour(s))  Culture, blood (routine x 2)     Status: None (Preliminary result)   Collection Time: 12/18/18  5:49 AM   Specimen: BLOOD RIGHT ARM  Result Value Ref Range Status   Specimen Description BLOOD RIGHT ARM  Final   Special Requests   Final    BOTTLES DRAWN AEROBIC AND ANAEROBIC Blood Culture adequate volume Performed at [facility], [address], [place], [place] [address]    Culture PENDING  Incomplete   Report Status PENDING  Incomplete  Culture, blood (routine x 2)     Status: None (Preliminary result)   Collection Time: 12/18/18  5:52 AM   Specimen: BLOOD RIGHT HAND  Result Value Ref Range Status   Specimen Description BLOOD RIGHT HAND  Final   Special Requests   Final    BOTTLES DRAWN AEROBIC AND ANAEROBIC Blood Culture adequate volume Performed at [facility], [address], [place], [place] [address]    Culture PENDING  Incomplete   Report Status PENDING  Incomplete    Medical History: Past Medical History:  Diagnosis Date  . Hypertension     Medications:  Anti-infectives (From admission, onward)   Start     Dose/Rate Route Frequency Ordered Stop   12/18/18 0530  Ampicillin-Sulbactam (UNASYN) 3 g in sodium  chloride 0.9 % 100 mL IVPB     3 g 200 mL/hr over 30 Minutes Intravenous Every 6 hours 12/18/18 0508        Assessment: 53 yo male with lung abscess.  Pharmacy has been asked to dose Unasyn.    Plan:  Preliminary review of pertinent patient information completed.  Protocol will be initiated with dose(s) of unasyn 3 grams Q6 hours.  Forestine Na clinical pharmacist will complete review during morning rounds to assess patient and finalize treatment regimen if needed.  Ikey Omary, [place], [place] 12/18/2018,6:55 AM

## 2018-12-18 NOTE — Progress Notes (Signed)
Pharmacy Antibiotic Note  Jared Pope is a 53 y.o. male admitted on 12/17/2018 with lung abscess.  Pharmacy has been consulted for Unasyn dosing. Cavitary lung lesion Plan: Unasyn 3gm IV q6h F/U cxs and clinical progress Monitor V/S, labs  Height: 5\' 7"  (170.2 cm) Weight: 147 lb (66.7 kg) IBW/kg (Calculated) : 66.1  Temp (24hrs), Avg:99.2 F (37.3 C), Min:99.2 F (37.3 C), Max:99.2 F (37.3 C)  Recent Labs  Lab 12/17/18 2303 12/18/18 0552  WBC 13.6* 14.0*  CREATININE 1.31* 1.17    Estimated Creatinine Clearance: 66.7 mL/min (by C-G formula based on SCr of 1.17 mg/dL).    Not on File  Antimicrobials this admission: unasyn 9/24 >>   Microbiology results: 9/24 BCx: pending 9/24 AFB smears Sputum: pending 9/24 SARS CV 2- negative  Thank you for allowing pharmacy to be a part of this patient's care.  Isac Sarna, BS Pharm D, [place] Clinical Pharmacist Pager [phone] 12/18/2018 10:31 AM

## 2018-12-18 NOTE — H&P (Addendum)
TRH H&P    Patient Demographics:    Jared Pope, is a 53 y.o. male  MRN: [id number]  DOB - 1965-12-25  Admit Date - 12/17/2018  Referring MD/NP/PA:   Joseph Berkshire  Outpatient Primary MD for the patient is Patient, [name]  [facility]: [facility]  Chief complaint-   Confusion,    HPI:    Jared Pope  is a 53 y.o. male,w hypertension, etoh abuse, presents from [facility] where he has been since 12/02/2018.  Apparently has had some confusion, frequent falls and unsteady on his feet per ER report.  His paperwork states sent to ER for ? Detox or withdrawal?.  Pt is currently in NAD, and is axoxo3 (person, city, and month, year).  Pt notes slight dry cough for the past 2 weeks.   In ED,  T 99.2, P 116, R 16, Bp 106/79  Pox 98% on RA Wt 66.7kg  CT brain IMPRESSION: 1. No acute intracranial abnormality. 2. Mild generalized atrophy and chronic small vessel ischemia, slightly advanced for age.  CT chest IMPRESSION: Cavitary lesion in the right upper lobe with ground-glass and nodular density in the adjacent lung parenchyma. Findings may represent fungal infection, abscess, TB. Malignancy is not excluded. Clinical correlation and follow-up to resolution after treatment is recommended.  Na 133, K 3.7, Bun 29, Creatinine 1.31 Wbc 13.6, Hgb 10.2, Plt 370 Ast 84, Alt 48, Alk phos 98, T. Bili 1.7 Trop 5 -> 6 Ammonia 20 ETOH <10  Pt will be admitted for confusion, and cavitary lesion of the right upper lung     Review of systems:    In addition to the HPI above,  No Fever-chills, No Headache, No changes with Vision or hearing, No problems swallowing food or Liquids, No Chest pain, No Shortness of Breath, No Abdominal pain, No Nausea or Vomiting, bowel movements are regular, No Blood in stool or Urine, No dysuria, No new skin rashes or bruises, No new joints  pains-aches,  No new weakness, tingling, numbness in any extremity, No recent weight gain or loss, No polyuria, polydypsia or polyphagia, No significant Mental Stressors.  All other systems reviewed and are negative.    Past History of the following :    Past Medical History:  Diagnosis Date  . Hypertension       History reviewed. No pertinent surgical history. None per patient   Social History:      Social History   Tobacco Use  . Smoking status: Never Smoker  . Smokeless tobacco: Never Used  Substance Use Topics  . Alcohol use: Yes       Family History :     Family History  Family history unknown: Yes   Pt didn't know his parents   Home Medications:   Prior to Admission medications   Not on File     Allergies:    Not on File   Physical Exam:   Vitals  Blood pressure 102/60, pulse 100, temperature 99.2 F (37.3 C), temperature source Oral, resp. rate Marland Kitchen)  28, height _0  (1.702 m), weight 66.7 kg, SpO2 97 %.  1.  General: axoxo3 (person, city, month, year)  2. Psychiatric: euthymic  3. Neurologic: cn2-12 intact, reflexes 2+ symmetric, diffuse with no clonus, motor 5/5 in all 4 ext  4. HEENMT:  Anicteric, pupils 1.60m symmetric, direct, consensual, near intact Neck: no jvd  5. Respiratory : CTAB  6. Cardiovascular : rrr s1, s2,   7. Gastrointestinal:  Abd: soft, nt, nd, +bs  8. Skin:  Ext: no c/c/e,  No rash, scab over the left elbow  9.Musculoskeletal:  Good ROM    Data Review:    CBC Recent Labs  Lab 12/17/18 2303  WBC 13.6*  HGB 10.2*  HCT 32.7*  PLT 370  MCV 98.8  MCH 30.8  MCHC 31.2  RDW 13.5  LYMPHSABS 2.8  MONOABS 1.3*  EOSABS 0.2  BASOSABS 0.1   ------------------------------------------------------------------------------------------------------------------  Results for orders placed or performed during the hospital encounter of 12/17/18 (from the past 48 hour(s))  Comprehensive metabolic panel      Status: Abnormal   Collection Time: 12/17/18 11:03 PM  Result Value Ref Range   Sodium 133 (L) 135 - 145 mmol/L   Potassium 3.7 3.5 - 5.1 mmol/L   Chloride 96 (L) 98 - 111 mmol/L   CO2 25 22 - 32 mmol/L   Glucose, Bld 102 (H) 70 - 99 mg/dL   BUN 29 (H) 6 - 20 mg/dL   Creatinine, Ser 1.31 (H) 0.61 - 1.24 mg/dL   Calcium 8.9 8.9 - 10.3 mg/dL   Total Protein 9.6 (H) 6.5 - 8.1 g/dL   Albumin 3.1 (L) 3.5 - 5.0 g/dL   AST 84 (H) 15 - 41 U/L   ALT 48 (H) 0 - 44 U/L   Alkaline Phosphatase 98 38 - 126 U/L   Total Bilirubin 1.7 (H) 0.3 - 1.2 mg/dL   GFR calc non Af Amer >60 >60 mL/min   GFR calc Af Amer >60 >60 mL/min   Anion gap 12 5 - 15    Comment: Performed at [facility] [address], [place] [id number] CBC with Differential     Status: Abnormal   Collection Time: 12/17/18 11:03 PM  Result Value Ref Range   WBC 13.6 (H) 4.0 - 10.5 K/uL   RBC 3.31 (L) 4.22 - 5.81 MIL/uL   Hemoglobin 10.2 (L) 13.0 - 17.0 g/dL   HCT 32.7 (L) 39.0 - 52.0 %   MCV 98.8 80.0 - 100.0 fL   MCH 30.8 26.0 - 34.0 pg   MCHC 31.2 30.0 - 36.0 g/dL   RDW 13.5 11.5 - 15.5 %   Platelets 370 150 - 400 K/uL   nRBC 0.0 0.0 - 0.2 %   Neutrophils Relative % 66 %   Neutro Abs 9.2 (H) 1.7 - 7.7 K/uL   Lymphocytes Relative 20 %   Lymphs Abs 2.8 0.7 - 4.0 K/uL   Monocytes Relative 10 %   Monocytes Absolute 1.3 (H) 0.1 - 1.0 K/uL   Eosinophils Relative 2 %   Eosinophils Absolute 0.2 0.0 - 0.5 K/uL   Basophils Relative 1 %   Basophils Absolute 0.1 0.0 - 0.1 K/uL   Immature Granulocytes 1 %   Abs Immature Granulocytes 0.07 0.00 - 0.07 K/uL    Comment: Performed at [facility] [address], [place] [id number] Troponin I (High Sensitivity)     Status: None   Collection Time: 12/17/18 11:03 PM  Result Value  Ref Range   Troponin I (High Sensitivity) 5 <18 ng/L    Comment: (NOTE) Elevated high sensitivity troponin I (hsTnI) values and significant  changes across serial measurements may suggest  ACS but many other  chronic and acute conditions are known to elevate hsTnI results.  Refer to the "Links" section for chest pain algorithms and additional  guidance. Performed at [facility], [address], [place], [place] [address]   Ammonia     Status: None   Collection Time: 12/17/18 11:03 PM  Result Value Ref Range   Ammonia 20 9 - 35 umol/L    Comment: Performed at [facility], [address] [place], [place] [address]  Ethanol     Status: None   Collection Time: 12/17/18 11:03 PM  Result Value Ref Range   Alcohol, Ethyl (B) <10 <10 mg/dL    Comment: (NOTE) Lowest detectable limit for serum alcohol is 10 mg/dL. For medical purposes only. Performed at [facility], [address] [place], [place] [address]   Troponin I (High Sensitivity)     Status: None   Collection Time: 12/18/18 12:47 AM  Result Value Ref Range   Troponin I (High Sensitivity) 6 <18 ng/L    Comment: (NOTE) Elevated high sensitivity troponin I (hsTnI) values and significant  changes across serial measurements may suggest ACS but many other  chronic and acute conditions are known to elevate hsTnI results.  Refer to the "Links" section for chest pain algorithms and additional  guidance. Performed at [facility], [address], [place], [place] [address]   Urinalysis, Routine w reflex microscopic     Status: Abnormal   Collection Time: 12/18/18  3:59 AM  Result Value Ref Range   Color, Urine AMBER (A) YELLOW    Comment: BIOCHEMICALS MAY BE AFFECTED BY COLOR   APPearance CLEAR CLEAR   Specific Gravity, Urine 1.033 (H) 1.005 - 1.030   pH 6.0 5.0 - 8.0   Glucose, UA NEGATIVE NEGATIVE mg/dL   Hgb urine dipstick NEGATIVE NEGATIVE   Bilirubin Urine NEGATIVE NEGATIVE   Ketones, ur NEGATIVE NEGATIVE mg/dL   Protein, ur NEGATIVE NEGATIVE mg/dL   Nitrite NEGATIVE NEGATIVE   Leukocytes,Ua NEGATIVE NEGATIVE    Comment: Performed at [facility], [address] [place],  [address]  Rapid urine drug  screen (hospital performed)     Status: Abnormal   Collection Time: 12/18/18  3:59 AM  Result Value Ref Range   Opiates NONE DETECTED NONE DETECTED   Cocaine NONE DETECTED NONE DETECTED   Benzodiazepines POSITIVE (A) NONE DETECTED   Amphetamines NONE DETECTED NONE DETECTED   Tetrahydrocannabinol NONE DETECTED NONE DETECTED   Barbiturates NONE DETECTED NONE DETECTED    Comment: (NOTE) DRUG SCREEN FOR MEDICAL PURPOSES ONLY.  IF CONFIRMATION IS NEEDED FOR ANY PURPOSE, NOTIFY LAB WITHIN 5 DAYS. LOWEST DETECTABLE LIMITS FOR URINE DRUG SCREEN Drug Class                     Cutoff (ng/mL) Amphetamine and metabolites    1000 Barbiturate and metabolites    200 Benzodiazepine                 921 Tricyclics and metabolites     300 Opiates and metabolites        300 Cocaine and metabolites        300 THC  33 Performed at [facility], [address] [place], [place] [address]     Chemistries  Recent Labs  Lab 12/17/18 2303  NA 133*  K 3.7  CL 96*  CO2 25  GLUCOSE 102*  BUN 29*  CREATININE 1.31*  CALCIUM 8.9  AST 84*  ALT 48*  ALKPHOS 98  BILITOT 1.7*   ------------------------------------------------------------------------------------------------------------------  ------------------------------------------------------------------------------------------------------------------ GFR: Estimated Creatinine Clearance: 59.6 mL/min (A) (by C-G formula based on SCr of 1.31 mg/dL (H)). Liver Function Tests: Recent Labs  Lab 12/17/18 2303  AST 84*  ALT 48*  ALKPHOS 98  BILITOT 1.7*  PROT 9.6*  ALBUMIN 3.1*   No results for input(s): LIPASE, AMYLASE in the last 168 hours. Recent Labs  Lab 12/17/18 2303  AMMONIA 20   Coagulation Profile: No results for input(s): INR, PROTIME in the last 168 hours. Cardiac Enzymes: No results for input(s): CKTOTAL, CKMB, CKMBINDEX, TROPONINI in the last 168 hours. BNP (last 3 results) No results for input(s):  PROBNP in the last 8760 hours. HbA1C: No results for input(s): HGBA1C in the last 72 hours. CBG: No results for input(s): GLUCAP in the last 168 hours. Lipid Profile: No results for input(s): CHOL, HDL, LDLCALC, TRIG, CHOLHDL, LDLDIRECT in the last 72 hours. Thyroid Function Tests: No results for input(s): TSH, T4TOTAL, FREET4, T3FREE, THYROIDAB in the last 72 hours. Anemia Panel: No results for input(s): VITAMINB12, FOLATE, FERRITIN, TIBC, IRON, RETICCTPCT in the last 72 hours.  --------------------------------------------------------------------------------------------------------------- Urine analysis:    Component Value Date/Time   COLORURINE AMBER (A) 12/18/2018 0359   APPEARANCEUR CLEAR 12/18/2018 0359   LABSPEC 1.033 (H) 12/18/2018 0359   PHURINE 6.0 12/18/2018 0359   GLUCOSEU NEGATIVE 12/18/2018 0359   HGBUR NEGATIVE 12/18/2018 0359   BILIRUBINUR NEGATIVE 12/18/2018 0359   KETONESUR NEGATIVE 12/18/2018 0359   PROTEINUR NEGATIVE 12/18/2018 0359   NITRITE NEGATIVE 12/18/2018 0359   LEUKOCYTESUR NEGATIVE 12/18/2018 0359      Imaging Results:    Dg Chest 2 View  Result Date: 12/18/2018 CLINICAL DATA:  Altered level of consciousness. EXAM: CHEST - 2 VIEW COMPARISON:  None. FINDINGS: Right upper lobe airspace opacity with central lucencies. Left lung is clear. Heart is normal in size. Slight right hilar prominence. No pulmonary edema, pleural fluid, or pneumothorax. No acute osseous abnormalities are seen. IMPRESSION: Right upper lobe opacity with central lucencies, probable cavitary process, infection versus malignancy. Recommend further evaluation with chest CT, preferably with IV contrast. Electronically Signed   By: Keith Rake M.D.   On: 12/18/2018 01:59   Ct Head Wo Contrast  Result Date: 12/17/2018 CLINICAL DATA:  Generalized muscle weakness. Altered mental status. Dizziness. EXAM: CT HEAD WITHOUT CONTRAST TECHNIQUE: Contiguous axial images were obtained from the  base of the skull through the vertex without intravenous contrast. COMPARISON:  Head CT 12/17/2016 FINDINGS: Brain: No evidence of acute infarction, hemorrhage, hydrocephalus, extra-axial collection or mass lesion/mass effect. Mild generalized atrophy. Mild chronic small vessel ischemia. 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 Vascular: No hyperdense vessel or unexpected calcification. Skull: No fracture or focal lesion. Sinuses/Orbits: Paranasal sinuses and mastoid air cells are clear. The visualized orbits are unremarkable. Other: Chronic right parietal scalp scarring. IMPRESSION: 1. No acute intracranial abnormality. 2. Mild generalized atrophy and chronic small vessel ischemia, slightly advanced for age. Electronically Signed   By: Keith Rake M.D.   On: 12/17/2018 23:12   Ct Chest W Contrast  Result Date: 12/18/2018 CLINICAL DATA:  53 year old male with dizziness.  EXAM: CT CHEST WITH CONTRAST TECHNIQUE: Multidetector CT imaging of the chest was performed during intravenous contrast administration. CONTRAST:  28m OMNIPAQUE IOHEXOL 300 MG/ML  SOLN COMPARISON:  Chest radiograph dated 12/18/2018 FINDINGS: Cardiovascular: There is no cardiomegaly or pericardial effusion. The thoracic aorta is unremarkable. The origins of the great vessels of the aortic arch appear patent. The central pulmonary arteries are grossly unremarkable for the degree of opacification. Mediastinum/Nodes: No hilar or mediastinal adenopathy. The esophagus and the thyroid gland are grossly unremarkable. No mediastinal fluid collection. Lungs/Pleura: There is a 3.3 x 3.0 cm cavitary lesion in the right upper lobe with ground-glass and nodular density in the adjacent lung parenchyma. Findings may represent fungal infection, abscess, TB. Malignancy is not excluded. Clinical correlation and follow-up to resolution after treatment is recommended. The left lung is clear. Upper Abdomen: Fatty infiltration  of the liver. The visualized upper abdomen is otherwise unremarkable. Musculoskeletal: No chest wall abnormality. No acute or significant osseous findings. IMPRESSION: Cavitary lesion in the right upper lobe with ground-glass and nodular density in the adjacent lung parenchyma. Findings may represent fungal infection, abscess, TB. Malignancy is not excluded. Clinical correlation and follow-up to resolution after treatment is recommended. Electronically Signed   By: AAnner CreteM.D.   On: 12/18/2018 03:21   nsr at 100, nl axis, nl int, no st-t changes c/w ischemia    Assessment & Plan:    Principal Problem:   Cavitary lesion of lung Active Problems:   Abnormal liver function   Hyponatremia   ARF (acute renal failure) (HCC)  Cavitary lesion of the right upper lung Negative pressure room AFB x3 Blood culture x2 Urine strep antigen Urine legionella antigen unasyn iv pharmacy to dose Pulmonary consult placed in computer  Abnormal liver function Check RUQ ultrasound  Mild ARF Hydrate with ns iv Check cmp in am  Hyponatremia Hydrate with ns iv Check cmp in am  Anemia Check ferritin , iron, tibc, folate, b12, esr Check cbc in am  ? Confusion, ETOH withdrawal Doubt etoh withdrawal due to has not been drinking since 12/02/2018 which was the first day of being in prison [facility]  DVT Prophylaxis-   Lovenox - SCDs   AM Labs Ordered, also please review Full Orders  Family Communication: Admission, patients condition and plan of care including tests being ordered have been discussed with the patient  who indicate understanding and agree with the plan and Code Status.  Code Status:  FULL CODE per patient  Admission status: Inpatient: Based on patients clinical presentation and evaluation of above clinical data, I have made determination that patient meets Inpatient criteria at this time. Pt will require AFB x3, and further evaluation of pulmonary cavitary lesion in the right upper  lung, pt has high risk of clinical deterioration and will require > 2 nites stay.  Time spent in minutes : 70 minutes   JJani GravelM.D on 12/18/2018 at 5:22 AM

## 2018-12-19 LAB — COMPREHENSIVE METABOLIC PANEL
ALT: 38 U/L (ref 0–44)
AST: 69 U/L — ABNORMAL HIGH (ref 15–41)
Albumin: 2.4 g/dL — ABNORMAL LOW (ref 3.5–5.0)
Alkaline Phosphatase: 92 U/L (ref 38–126)
Anion gap: 8 (ref 5–15)
BUN: 23 mg/dL — ABNORMAL HIGH (ref 6–20)
CO2: 24 mmol/L (ref 22–32)
Calcium: 8.1 mg/dL — ABNORMAL LOW (ref 8.9–10.3)
Chloride: 99 mmol/L (ref 98–111)
Creatinine, Ser: 1.02 mg/dL (ref 0.61–1.24)
GFR calc Af Amer: >60 mL/min (ref >60)
GFR calc non Af Amer: >60 mL/min (ref >60)
Glucose, Bld: 101 mg/dL — ABNORMAL HIGH (ref 70–99)
Potassium: 3.8 mmol/L (ref 3.5–5.1)
Sodium: 131 mmol/L — ABNORMAL LOW (ref 135–145)
Total Bilirubin: 1.3 mg/dL — ABNORMAL HIGH (ref 0.3–1.2)
Total Protein: 7.9 g/dL (ref 6.5–8.1)

## 2018-12-19 LAB — FOLATE RBC
Folate, Hemolysate: 286 ng/mL
Folate, RBC: 1163 ng/mL (ref >498)
Hematocrit: 24.6 % — ABNORMAL LOW (ref 37.5–51.0)

## 2018-12-19 LAB — ACID FAST SMEAR (AFB, MYCOBACTERIA): Acid Fast Smear: NEGATIVE

## 2018-12-19 LAB — CBC
HCT: 25 % — ABNORMAL LOW (ref 39.0–52.0)
Hemoglobin: 8.1 g/dL — ABNORMAL LOW (ref 13.0–17.0)
MCH: 31.3 pg (ref 26.0–34.0)
MCHC: 32.4 g/dL (ref 30.0–36.0)
MCV: 96.5 fL (ref 80.0–100.0)
Platelets: 328 10*3/uL (ref 150–400)
RBC: 2.59 MIL/uL — ABNORMAL LOW (ref 4.22–5.81)
RDW: 13.1 % (ref 11.5–15.5)
WBC: 9.9 10*3/uL (ref 4.0–10.5)
nRBC: 0 % (ref 0.0–0.2)

## 2018-12-19 LAB — MAGNESIUM: Magnesium: 1.6 mg/dL — ABNORMAL LOW (ref 1.7–2.4)

## 2018-12-19 LAB — HEPATITIS PANEL, ACUTE
HCV Ab: >11 s/co ratio — ABNORMAL HIGH (ref 0.0–0.9)
Hep A IgM: NEGATIVE
Hep B C IgM: NEGATIVE
Hepatitis B Surface Ag: NEGATIVE

## 2018-12-19 LAB — HIV ANTIBODY (ROUTINE TESTING W REFLEX): HIV Screen 4th Generation wRfx: NONREACTIVE

## 2018-12-19 LAB — T4, FREE: Free T4: 1.23 ng/dL — ABNORMAL HIGH (ref 0.61–1.12)

## 2018-12-19 LAB — LEGIONELLA PNEUMOPHILA SEROGP 1 UR AG: L. pneumophila Serogp 1 Ur Ag: NEGATIVE

## 2018-12-19 MED ORDER — INFLUENZA VAC SPLIT QUAD 0.5 ML IM SUSY
0.5000 mL | PREFILLED_SYRINGE | INTRAMUSCULAR | Status: DC
  Filled 2018-12-19 (×2): qty 0.5

## 2018-12-19 NOTE — Plan of Care (Signed)

## 2018-12-19 NOTE — Progress Notes (Signed)
PROGRESS NOTE        PATIENT DETAILS Name: Jared Pope Age: 53 y.o. Sex: male Date of Birth: 1966-01-06 Admit Date: 12/17/2018 Admitting Physician Pearson Grippe, MD [id number], [name]  Brief Narrative: Patient is a 53 y.o. male with history of alcohol abuse, HTN-currently incarcerated at a local prison for almost 2 weeks-presented to the hospital for weakness and dizziness, further evaluation revealed a right upper lobe lung cavitary lesion.  Subsequently admitted for further evaluation and treatment. -Was admitted remained stable, for work-up of the cavitary lesion ruling out TB versus infection.  Pulmonary team consulted, following closely. ---------------------------------------------------------------------------------------------------------------------------  Subjective: The patient was seen and examined this morning stable no acute distress. Requesting if he can have a couple coffee. Denies any shortness of breath.  Denies any fever cough night sweats.  Reporting and confirming weight loss 10 to 15 pounds over the past year.    ---------------------------------------------------------------------------------------------------------------------------  Assessment/Plan: Right upper lung cavitary lesion: -Pulmonary following - appreciate input -Suspecting probably lung abscess-antibiogram of alcohol use and very poor dentition.  However he has all the risk factors for tuberculosis. -  Maintain negative/airborne isolation-await sputum AFB x3 .Marland Kitchen In progress   In the meantime, continue with Unasyn for presumed lung abscess.   History of alcohol abuse: -stable, monitoring Has been incarcerated for almost 2 weeks-no current symptoms consistent with alcohol withdrawal.  I suspect he is out of the window for alcohol withdrawal. - still on CIWA protocol as needed   AKI: Mild-resolved with supportive care.  Hypokalemia/hypomagnesemia: Replete and  recheck.  HCV  - HCV ab >11.0 -Will follow with HCV RNA by PCR  -needs to F/up as out pt to possible treatment HIV screening  -HIV nonreactive, hepatitis a and B antigen negative   Diet: Diet Order            Diet Heart Room service appropriate? Yes; Fluid consistency: Thin  Diet effective now               DVT Prophylaxis: Prophylactic Lovenox  Code Status: Full code  Family Communication: None at bedside  Disposition Plan: Remain inpatient  Barriers for discharge: Pulmonary tuberculosis will need to be ruled out-awaiting sputum AFB smear x3.  Antimicrobial agents: Anti-infectives (From admission, onward)   Start     Dose/Rate Route Frequency Ordered Stop   12/18/18 0530  Ampicillin-Sulbactam (UNASYN) 3 g in sodium chloride 0.9 % 100 mL IVPB     3 g 200 mL/hr over 30 Minutes Intravenous Every 6 hours 12/18/18 0508        Procedures: None CONSULTS:  pulmonary/intensive care  Time spent: 25- minutes-Greater than 50% of this time was spent in counseling, explanation of diagnosis, planning of further management, and coordination of care.  MEDICATIONS: Scheduled Meds: . enoxaparin (LOVENOX) injection  40 mg Subcutaneous Q24H  . feeding supplement (PRO-STAT SUGAR FREE 64)  30 mL Oral BID  . folic acid  1 mg Oral Daily  . [START ON 12/20/2018] influenza vac split quadrivalent PF  0.5 mL Intramuscular Tomorrow-1000  . LORazepam  0-4 mg Intravenous Q6H   Followed by  . [START ON 12/20/2018] LORazepam  0-4 mg Intravenous Q12H  . multivitamin with minerals  1 tablet Oral Daily  . thiamine  100 mg Oral Daily   Or  . thiamine  100 mg Intravenous Daily  Continuous Infusions: . ampicillin-sulbactam (UNASYN) IV 3 g (12/19/18 1128)   PRN Meds:.acetaminophen **OR** acetaminophen, LORazepam **OR** LORazepam   PHYSICAL EXAM: Vital signs: Vitals:   12/19/18 0500 12/19/18 0853 12/19/18 1115 12/19/18 1203  BP: 108/72 97/69 106/64 111/69  Pulse: 82 98 93 88  Resp:  18  16 20   Temp: 98.5 F (36.9 C)  100.2 F (37.9 C) 98.5 F (36.9 C)  TempSrc: Oral  Oral   SpO2: 100%  100% 100%  Weight:      Height:       Filed Weights   12/17/18 1526 12/18/18 2300  Weight: 66.7 kg 60.7 kg   Body mass index is 20.96 kg/m.   BP 111/69 (BP Location: Right Arm)   Pulse 88   Temp 98.5 F (36.9 C)   Resp 20   Ht 5\' 7"  (1.702 m)   Wt 60.7 kg   SpO2 100%   BMI 20.96 kg/m    Physical Exam  Constitution:  Alert, cooperative, no distress,  Psychiatric: Normal and stable mood and affect, cognition intact,   HEENT: Normocephalic, PERRL, otherwise with in Normal limits  Chest:Chest symmetric Cardio vascular:  S1/S2, RRR, No murmure, No Rubs or Gallops  pulmonary: Clear to auscultation bilaterally, respirations unlabored, negative wheezes / crackles Abdomen: Soft, non-tender, non-distended, bowel sounds,no masses, no organomegaly Muscular skeletal: Limited exam - in bed, able to move all 4 extremities, Normal strength,  Neuro: CNII-XII intact. , normal motor and sensation, reflexes intact  Extremities: No pitting edema lower extremities, +2 pulses  Skin: Dry, warm to touch, negative for any Rashes, No open wounds Wounds: per nursing documentation   I have personally reviewed following labs and imaging studies  LABORATORY DATA: CBC: Recent Labs  Lab 12/17/18 2303 12/18/18 0552 12/19/18 0740  WBC 13.6* 14.0* 9.9  NEUTROABS 9.2*  --   --   HGB 10.2* 8.4* 8.1*  HCT 32.7* 26.3* 25.0*  MCV 98.8 97.4 96.5  PLT 370 350 696    Basic Metabolic Panel: Recent Labs  Lab 12/17/18 2303 12/18/18 0552 12/19/18 0740  NA 133* 133* 131*  K 3.7 3.6 3.8  CL 96* 98 99  CO2 25 26 24   GLUCOSE 102* 107* 101*  BUN 29* 30* 23*  CREATININE 1.31* 1.17 1.02  CALCIUM 8.9 8.1* 8.1*  MG  --  1.3* 1.6*  PHOS  --  3.0  --     GFR: Estimated Creatinine Clearance: 70.3 mL/min (by C-G formula based on SCr of 1.02 mg/dL).  Liver Function Tests: Recent Labs  Lab  12/17/18 2303 12/18/18 0552 12/19/18 0740  AST 84* 73* 69*  ALT 48* 41 38  ALKPHOS 98 91 92  BILITOT 1.7* 1.4* 1.3*  PROT 9.6* 8.1 7.9  ALBUMIN 3.1* 2.6* 2.4*   No results for input(s): LIPASE, AMYLASE in the last 168 hours. Recent Labs  Lab 12/17/18 2303  AMMONIA 20    Thyroid Function Tests: Recent Labs    12/17/18 2303  TSH 5.286*    Anemia Panel: Recent Labs    12/18/18 0549  VITAMINB12 2,605*  FERRITIN 590*  TIBC 280  IRON 30*    Urine analysis:    Component Value Date/Time   COLORURINE AMBER (A) 12/18/2018 0359   APPEARANCEUR CLEAR 12/18/2018 0359   LABSPEC 1.033 (H) 12/18/2018 0359   PHURINE 6.0 12/18/2018 [place] 12/18/2018 0359   HGBUR NEGATIVE 12/18/2018 [place] NEGATIVE 12/18/2018 [place] 12/18/2018 0359  PROTEINUR NEGATIVE 12/18/2018 0359   NITRITE NEGATIVE 12/18/2018 0359   LEUKOCYTESUR NEGATIVE 12/18/2018 0359    Sepsis Labs: Lactic Acid, Venous No results found for: LATICACIDVEN  MICROBIOLOGY: Recent Results (from the past 240 hour(s))  Culture, blood (routine x 2)     Status: None (Preliminary result)   Collection Time: 12/18/18  5:49 AM   Specimen: BLOOD RIGHT ARM  Result Value Ref Range Status   Specimen Description BLOOD RIGHT ARM  Final   Special Requests   Final    BOTTLES DRAWN AEROBIC AND ANAEROBIC Blood Culture adequate volume   Culture   Final    NO GROWTH 1 DAY Performed at [facility], [address], [place], [place] [address]    Report Status PENDING  Incomplete  Culture, blood (routine x 2)     Status: None (Preliminary result)   Collection Time: 12/18/18  5:52 AM   Specimen: BLOOD RIGHT HAND  Result Value Ref Range Status   Specimen Description BLOOD RIGHT HAND  Final   Special Requests   Final    BOTTLES DRAWN AEROBIC AND ANAEROBIC Blood Culture adequate volume   Culture   Final    NO GROWTH 1 DAY Performed at [facility], [address] [place], [place]  [address]    Report Status PENDING  Incomplete  SARS Coronavirus 2 [facility] order, Performed in [facility] hospital lab) Nasopharyngeal Nasopharyngeal Swab     Status: None   Collection Time: 12/18/18  6:21 AM   Specimen: Nasopharyngeal Swab  Result Value Ref Range Status   SARS Coronavirus 2 NEGATIVE NEGATIVE Final    Comment: (NOTE) If result is NEGATIVE SARS-CoV-2 target nucleic acids are NOT DETECTED. The SARS-CoV-2 RNA is generally detectable in upper and lower  respiratory specimens during the acute phase of infection. The lowest  concentration of SARS-CoV-2 viral copies this assay can detect is 250  copies / mL. A negative result does not preclude SARS-CoV-2 infection  and should not be used as the sole basis for treatment or other  patient management decisions.  A negative result may occur with  improper specimen collection / handling, submission of specimen other  than nasopharyngeal swab, presence of viral mutation(s) within the  areas targeted by this assay, and inadequate number of viral copies  (<250 copies / mL). A negative result must be combined with clinical  observations, patient history, and epidemiological information. If result is POSITIVE SARS-CoV-2 target nucleic acids are DETECTED. The SARS-CoV-2 RNA is generally detectable in upper and lower  respiratory specimens dur ing the acute phase of infection.  Positive  results are indicative of active infection with SARS-CoV-2.  Clinical  correlation with patient history and other diagnostic information is  necessary to determine patient infection status.  Positive results do  not rule out bacterial infection or co-infection with other viruses. If result is PRESUMPTIVE POSTIVE SARS-CoV-2 nucleic acids MAY BE PRESENT.   A presumptive positive result was obtained on the submitted specimen  and confirmed on repeat testing.  While 2019 novel coronavirus  (SARS-CoV-2) nucleic acids may be present in the submitted sample   additional confirmatory testing may be necessary for epidemiological  and / or clinical management purposes  to differentiate between  SARS-CoV-2 and other Sarbecovirus currently known to infect humans.  If clinically indicated additional testing with an alternate test  methodology [phone]) is advised. The SARS-CoV-2 RNA is generally  detectable in upper and lower respiratory sp ecimens during the acute  phase of infection.  The expected result is Negative. Fact Sheet for Patients:  BoilerBrush.com.cy Fact Sheet for Healthcare Providers: https://pope.com/ This test is not yet approved or cleared by the [place] FDA and has been authorized for detection and/or diagnosis of SARS-CoV-2 by FDA under an Emergency Use Authorization (EUA).  This EUA will remain in effect (meaning this test can be used) for the duration of the COVID-19 declaration under Section 564(b)(1) of the Act, 21 U.S.C. section 360bbb-3(b)(1), unless the authorization is terminated or revoked sooner. Performed at [facility], [address] [place], [place] [address]     RADIOLOGY STUDIES/RESULTS: Dg Chest 2 View  Result Date: 12/18/2018 CLINICAL DATA:  Altered level of consciousness. EXAM: CHEST - 2 VIEW COMPARISON:  None. FINDINGS: Right upper lobe airspace opacity with central lucencies. Left lung is clear. Heart is normal in size. Slight right hilar prominence. No pulmonary edema, pleural fluid, or pneumothorax. No acute osseous abnormalities are seen. IMPRESSION: Right upper lobe opacity with central lucencies, probable cavitary process, infection versus malignancy. Recommend further evaluation with chest CT, preferably with IV contrast. Electronically Signed   By: Narda Rutherford M.D.   On: 12/18/2018 01:59   Ct Head Wo Contrast  Result Date: 12/17/2018 CLINICAL DATA:  Generalized muscle weakness. Altered mental status. Dizziness. EXAM: CT HEAD WITHOUT CONTRAST  TECHNIQUE: Contiguous axial images were obtained from the base of the skull through the vertex without intravenous contrast. COMPARISON:  Head CT 12/17/2016 FINDINGS: Brain: No evidence of acute infarction, hemorrhage, hydrocephalus, extra-axial collection or mass lesion/mass effect. Mild generalized atrophy. Mild chronic small vessel ischemia. 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 Vascular: No hyperdense vessel or unexpected calcification. Skull: No fracture or focal lesion. Sinuses/Orbits: Paranasal sinuses and mastoid air cells are clear. The visualized orbits are unremarkable. Other: Chronic right parietal scalp scarring. IMPRESSION: 1. No acute intracranial abnormality. 2. Mild generalized atrophy and chronic small vessel ischemia, slightly advanced for age. Electronically Signed   By: Narda Rutherford M.D.   On: 12/17/2018 23:12   Ct Chest W Contrast  Result Date: 12/18/2018 CLINICAL DATA:  53 year old male with dizziness. EXAM: CT CHEST WITH CONTRAST TECHNIQUE: Multidetector CT imaging of the chest was performed during intravenous contrast administration. CONTRAST:  23mL OMNIPAQUE IOHEXOL 300 MG/ML  SOLN COMPARISON:  Chest radiograph dated 12/18/2018 FINDINGS: Cardiovascular: There is no cardiomegaly or pericardial effusion. The thoracic aorta is unremarkable. The origins of the great vessels of the aortic arch appear patent. The central pulmonary arteries are grossly unremarkable for the degree of opacification. Mediastinum/Nodes: No hilar or mediastinal adenopathy. The esophagus and the thyroid gland are grossly unremarkable. No mediastinal fluid collection. Lungs/Pleura: There is a 3.3 x 3.0 cm cavitary lesion in the right upper lobe with ground-glass and nodular density in the adjacent lung parenchyma. Findings may represent fungal infection, abscess, TB. Malignancy is not excluded. Clinical correlation and follow-up to resolution after treatment is recommended.  The left lung is clear. Upper Abdomen: Fatty infiltration of the liver. The visualized upper abdomen is otherwise unremarkable. Musculoskeletal: No chest wall abnormality. No acute or significant osseous findings. IMPRESSION: Cavitary lesion in the right upper lobe with ground-glass and nodular density in the adjacent lung parenchyma. Findings may represent fungal infection, abscess, TB. Malignancy is not excluded. Clinical correlation and follow-up to resolution after treatment is recommended. Electronically Signed   By: Elgie Collard M.D.   On: 12/18/2018 03:21   US Abdomen Limited Ruq  Result Date: 12/18/2018 CLINICAL DATA:  Abnormal  LFTs EXAM: ULTRASOUND ABDOMEN LIMITED RIGHT UPPER QUADRANT COMPARISON:  None Correlation CT chest 12/18/2018 FINDINGS: Gallbladder: Incompletely distended. Gallbladder wall appears thickened. No shadowing calculi or pericholecystic fluid. No sonographic Murphy sign. Common bile duct: Diameter: 6 mm, upper normal Liver: Echogenic parenchyma, likely fatty infiltration though this can be seen with cirrhosis and certain infiltrative disorders. 7 x 6 x 11 mm hyperechoic focus within RIGHT lobe of liver without shadowing nonspecific but question tiny hemangioma. This is questionably visualized as a hypoechoic nodule on the CT chest exam series 2, image 120. Portal vein is patent on color Doppler imaging with normal direction of blood flow towards the liver. Other: No RIGHT upper quadrant free fluid. IMPRESSION: Nonspecific mild wall thickening of the gallbladder without evidence of gallstones or pericholecystic fluid. Probable mild fatty infiltration of liver. Question 11 mm hemangioma RIGHT lobe liver, questionably visualized as slightly low-attenuation nodule on CT. Electronically Signed   By: Ulyses SouthwardMark  Boles M.D.   On: 12/18/2018 10:47     LOS: 1 day   Kendell BaneSeyed A , MD  [facility]  If 7PM-7AM, please contact night-coverage  Please page via [email or web address]  Go  to [email or web address] and use [place]'s universal password to access. If you do not have the password, please contact the hospital operator.  Locate the [facility] provider you are looking for under [facility] and page to a number that you can be directly reached. If you still have difficulty reaching the provider, please page the [facility] (Director on Call) for the Hospitalists listed on amion for assistance.  12/19/2018, 1:00 PM

## 2018-12-19 NOTE — Clinical Social Work Note (Addendum)
CSW attempted to reach patient by room phone, per consult request, as patient is still on full precautions.  No answer.  CSW will try again later.

## 2018-12-19 NOTE — Progress Notes (Signed)
Subjective: He has no new complaints.  He says he still coughing.  He is much more alert.  AFBs are pending  Objective: Vital signs in last 24 hours: Temp:  [98.5 F (36.9 C)-100.2 F (37.9 C)] 98.5 F (36.9 C) (09/25 0500) Pulse Rate:  [82-97] 82 (09/25 0500) Resp:  [18-25] 18 (09/25 0500) BP: (108-131)/(63-80) 108/72 (09/25 0500) SpO2:  [98 %-100 %] 100 % (09/25 0500) Weight:  [60.7 kg] 60.7 kg (09/24 2300) Weight change: -5.979 kg Last BM Date: 12/18/18  Intake/Output from previous day: 09/24 0701 - 09/25 0700 In: 1051.7 [I.V.:601.7; IV Piggyback:450] Out: -   PHYSICAL EXAM General appearance: alert, cooperative and no distress Resp: rhonchi bilaterally Cardio: regular rate and rhythm, S1, S2 normal, no murmur, click, rub or gallop GI: soft, non-tender; bowel sounds normal; no masses,  no organomegaly Extremities: extremities normal, atraumatic, no cyanosis or edema  Lab Results:  Results for orders placed or performed during the hospital encounter of 12/17/18 (from the past 48 hour(s))  Comprehensive metabolic panel     Status: Abnormal   Collection Time: 12/17/18 11:03 PM  Result Value Ref Range   Sodium 133 (L) 135 - 145 mmol/L   Potassium 3.7 3.5 - 5.1 mmol/L   Chloride 96 (L) 98 - 111 mmol/L   CO2 25 22 - 32 mmol/L   Glucose, Bld 102 (H) 70 - 99 mg/dL   BUN 29 (H) 6 - 20 mg/dL   Creatinine, Ser 1.31 (H) 0.61 - 1.24 mg/dL   Calcium 8.9 8.9 - 10.3 mg/dL   Total Protein 9.6 (H) 6.5 - 8.1 g/dL   Albumin 3.1 (L) 3.5 - 5.0 g/dL   AST 84 (H) 15 - 41 U/L   ALT 48 (H) 0 - 44 U/L   Alkaline Phosphatase 98 38 - 126 U/L   Total Bilirubin 1.7 (H) 0.3 - 1.2 mg/dL   GFR calc non Af Amer >60 >60 mL/min   GFR calc Af Amer >60 >60 mL/min   Anion gap 12 5 - 15    Comment: Performed at [facility], [address], [place], [place] [address]  CBC with Differential     Status: Abnormal   Collection Time: 12/17/18 11:03 PM  Result Value Ref Range   WBC 13.6 (H) 4.0 - 10.5  K/uL   RBC 3.31 (L) 4.22 - 5.81 MIL/uL   Hemoglobin 10.2 (L) 13.0 - 17.0 g/dL   HCT 32.7 (L) 39.0 - 52.0 %   MCV 98.8 80.0 - 100.0 fL   MCH 30.8 26.0 - 34.0 pg   MCHC 31.2 30.0 - 36.0 g/dL   RDW 13.5 11.5 - 15.5 %   Platelets 370 150 - 400 K/uL   nRBC 0.0 0.0 - 0.2 %   Neutrophils Relative % 66 %   Neutro Abs 9.2 (H) 1.7 - 7.7 K/uL   Lymphocytes Relative 20 %   Lymphs Abs 2.8 0.7 - 4.0 K/uL   Monocytes Relative 10 %   Monocytes Absolute 1.3 (H) 0.1 - 1.0 K/uL   Eosinophils Relative 2 %   Eosinophils Absolute 0.2 0.0 - 0.5 K/uL   Basophils Relative 1 %   Basophils Absolute 0.1 0.0 - 0.1 K/uL   Immature Granulocytes 1 %   Abs Immature Granulocytes 0.07 0.00 - 0.07 K/uL    Comment: Performed at [facility], [address], [place], [place] [address]  Troponin I (High Sensitivity)     Status: None   Collection Time: 12/17/18 11:03 PM  Result  Value Ref Range   Troponin I (High Sensitivity) 5 <18 ng/L    Comment: (NOTE) Elevated high sensitivity troponin I (hsTnI) values and significant  changes across serial measurements may suggest ACS but many other  chronic and acute conditions are known to elevate hsTnI results.  Refer to the "Links" section for chest pain algorithms and additional  guidance. Performed at [facility], [address], [place], [place] [address]   Ammonia     Status: None   Collection Time: 12/17/18 11:03 PM  Result Value Ref Range   Ammonia 20 9 - 35 umol/L    Comment: Performed at [facility], [address], [place], [place] [address]  Ethanol     Status: None   Collection Time: 12/17/18 11:03 PM  Result Value Ref Range   Alcohol, Ethyl (B) <10 <10 mg/dL    Comment: (NOTE) Lowest detectable limit for serum alcohol is 10 mg/dL. For medical purposes only. Performed at [facility], [address], [place], [place] [address]   TSH     Status: Abnormal   Collection Time: 12/17/18 11:03 PM  Result Value Ref Range   TSH 5.286 (H) 0.350 - 4.500 uIU/mL     Comment: Performed by a 3rd Generation assay with a functional sensitivity of <=0.01 uIU/mL. Performed at [facility], [address], [place], [place] [address]   Troponin I (High Sensitivity)     Status: None   Collection Time: 12/18/18 12:47 AM  Result Value Ref Range   Troponin I (High Sensitivity) 6 <18 ng/L    Comment: (NOTE) Elevated high sensitivity troponin I (hsTnI) values and significant  changes across serial measurements may suggest ACS but many other  chronic and acute conditions are known to elevate hsTnI results.  Refer to the "Links" section for chest pain algorithms and additional  guidance. Performed at [facility], [address], [place], [place] [address]   Urinalysis, Routine w reflex microscopic     Status: Abnormal   Collection Time: 12/18/18  3:59 AM  Result Value Ref Range   Color, Urine AMBER (A) YELLOW    Comment: BIOCHEMICALS MAY BE AFFECTED BY COLOR   APPearance CLEAR CLEAR   Specific Gravity, Urine 1.033 (H) 1.005 - 1.030   pH 6.0 5.0 - 8.0   Glucose, UA NEGATIVE NEGATIVE mg/dL   Hgb urine dipstick NEGATIVE NEGATIVE   Bilirubin Urine NEGATIVE NEGATIVE   Ketones, ur NEGATIVE NEGATIVE mg/dL   Protein, ur NEGATIVE NEGATIVE mg/dL   Nitrite NEGATIVE NEGATIVE   Leukocytes,Ua NEGATIVE NEGATIVE    Comment: Performed at [facility], [address] [place], [place] [address]  Rapid urine drug screen (hospital performed)     Status: Abnormal   Collection Time: 12/18/18  3:59 AM  Result Value Ref Range   Opiates NONE DETECTED NONE DETECTED   Cocaine NONE DETECTED NONE DETECTED   Benzodiazepines POSITIVE (A) NONE DETECTED   Amphetamines NONE DETECTED NONE DETECTED   Tetrahydrocannabinol NONE DETECTED NONE DETECTED   Barbiturates NONE DETECTED NONE DETECTED    Comment: (NOTE) DRUG SCREEN FOR MEDICAL PURPOSES ONLY.  IF CONFIRMATION IS NEEDED FOR ANY PURPOSE, NOTIFY LAB WITHIN 5 DAYS. LOWEST DETECTABLE LIMITS FOR URINE DRUG SCREEN Drug Class                      Cutoff (ng/mL) Amphetamine and metabolites    1000 Barbiturate and metabolites    200 Benzodiazepine  200 Tricyclics and metabolites     300 Opiates and metabolites        300 Cocaine and metabolites        300 THC                            50 Performed at [facility], [address], [place], [place] [address]   Strep pneumoniae urinary antigen     Status: None   Collection Time: 12/18/18  5:37 AM  Result Value Ref Range   Strep Pneumo Urinary Antigen NEGATIVE NEGATIVE    Comment:        Infection due to S. pneumoniae cannot be absolutely ruled out since the antigen present may be below the detection limit of the test. Performed at [facility], [address], [place], [place] [address]   Culture, blood (routine x 2)     Status: None (Preliminary result)   Collection Time: 12/18/18  5:49 AM   Specimen: BLOOD RIGHT ARM  Result Value Ref Range   Specimen Description BLOOD RIGHT ARM    Special Requests      BOTTLES DRAWN AEROBIC AND ANAEROBIC Blood Culture adequate volume   Culture      NO GROWTH 1 DAY Performed at [facility], [address], [place], [place] [address]    Report Status PENDING   Ferritin     Status: Abnormal   Collection Time: 12/18/18  5:49 AM  Result Value Ref Range   Ferritin 590 (H) 24 - 336 ng/mL    Comment: Performed at [facility], [address], [place], [place] [address]  Iron and TIBC     Status: Abnormal   Collection Time: 12/18/18  5:49 AM  Result Value Ref Range   Iron 30 (L) 45 - 182 ug/dL   TIBC 275 170 - 017 ug/dL   Saturation Ratios 11 (L) 17.9 - 39.5 %   UIBC 250 ug/dL    Comment: Performed at [facility], [address] [place], [place] [address]  Vitamin B12     Status: Abnormal   Collection Time: 12/18/18  5:49 AM  Result Value Ref Range   Vitamin B-12 2,605 (H) 180 - 914 pg/mL    Comment: RESULTS CONFIRMED BY MANUAL DILUTION Performed at [facility], [address], [place], [place] [address]    HIV Antibody  (Routine Testing)     Status: None   Collection Time: 12/18/18  5:52 AM  Result Value Ref Range   HIV Screen 4th Generation wRfx Non Reactive Non Reactive    Comment: (NOTE) Performed At: [facility] [address] [place], [place] [id number] Jolene Schimke MD JT:[id number]   Comprehensive metabolic panel     Status: Abnormal   Collection Time: 12/18/18  5:52 AM  Result Value Ref Range   Sodium 133 (L) 135 - 145 mmol/L   Potassium 3.6 3.5 - 5.1 mmol/L   Chloride 98 98 - 111 mmol/L   CO2 26 22 - 32 mmol/L   Glucose, Bld 107 (H) 70 - 99 mg/dL   BUN 30 (H) 6 - 20 mg/dL   Creatinine, Ser 0.09 0.61 - 1.24 mg/dL   Calcium 8.1 (L) 8.9 - 10.3 mg/dL   Total Protein 8.1 6.5 - 8.1 g/dL   Albumin 2.6 (L) 3.5 - 5.0 g/dL   AST 73 (H) 15 - 41 U/L   ALT 41 0 - 44 U/L   Alkaline Phosphatase 91  38 - 126 U/L   Total Bilirubin 1.4 (H) 0.3 - 1.2 mg/dL   GFR calc non Af Amer >60 >60 mL/min   GFR calc Af Amer >60 >60 mL/min   Anion gap 9 5 - 15    Comment: Performed at [facility], [address], [place], [place] [address]  CBC     Status: Abnormal   Collection Time: 12/18/18  5:52 AM  Result Value Ref Range   WBC 14.0 (H) 4.0 - 10.5 K/uL   RBC 2.70 (L) 4.22 - 5.81 MIL/uL   Hemoglobin 8.4 (L) 13.0 - 17.0 g/dL   HCT 19.1 (L) 47.8 - 29.5 %   MCV 97.4 80.0 - 100.0 fL   MCH 31.1 26.0 - 34.0 pg   MCHC 31.9 30.0 - 36.0 g/dL   RDW 62.1 30.8 - 65.7 %   Platelets 350 150 - 400 K/uL   nRBC 0.0 0.0 - 0.2 %    Comment: Performed at [facility], [address], [place], [place] [address]  Culture, blood (routine x 2)     Status: None (Preliminary result)   Collection Time: 12/18/18  5:52 AM   Specimen: BLOOD RIGHT HAND  Result Value Ref Range   Specimen Description BLOOD RIGHT HAND    Special Requests      BOTTLES DRAWN AEROBIC AND ANAEROBIC Blood Culture adequate volume   Culture      NO GROWTH 1 DAY Performed at [facility], [address] [place], [place] [address]     Report Status PENDING   Hepatitis panel, acute     Status: Abnormal   Collection Time: 12/18/18  5:52 AM  Result Value Ref Range   Hepatitis B Surface Ag Negative Negative   HCV Ab >11.0 (H) 0.0 - 0.9 s/co ratio    Comment: (NOTE)                                  Negative:     < 0.8                             Indeterminate: 0.8 - 0.9                                  Positive:     > 0.9 The CDC recommends that a positive HCV antibody result be followed up with a HCV Nucleic Acid Amplification test (413244). Performed At: [facility] [address] [place], [place] [id number] Jolene Schimke MD UY:[id number]    Hep A IgM Negative Negative   Hep B C IgM Negative Negative  Magnesium     Status: Abnormal   Collection Time: 12/18/18  5:52 AM  Result Value Ref Range   Magnesium 1.3 (L) 1.7 - 2.4 mg/dL    Comment: Performed at [facility], [address], [place], [place] [address]  Phosphorus     Status: None   Collection Time: 12/18/18  5:52 AM  Result Value Ref Range   Phosphorus 3.0 2.5 - 4.6 mg/dL    Comment: Performed at [facility], [address] [place], [place] [address]  SARS Coronavirus 2 [facility] order, Performed in [facility] hospital lab) Nasopharyngeal Nasopharyngeal Swab     Status: None   Collection Time: 12/18/18  6:21 AM   Specimen: Nasopharyngeal Swab  Result Value Ref Range   SARS Coronavirus 2 NEGATIVE NEGATIVE    Comment: (NOTE) If result is NEGATIVE SARS-CoV-2 target nucleic acids are NOT DETECTED. The SARS-CoV-2 RNA is generally detectable in upper and lower  respiratory specimens during the acute phase of infection. The lowest  concentration of SARS-CoV-2 viral copies this assay can detect is 250  copies / mL. A negative result does not preclude SARS-CoV-2 infection  and should not be used as the sole basis for treatment or other  patient management decisions.  A negative result may occur with  improper specimen collection / handling,  submission of specimen other  than nasopharyngeal swab, presence of viral mutation(s) within the  areas targeted by this assay, and inadequate number of viral copies  (<250 copies / mL). A negative result must be combined with clinical  observations, patient history, and epidemiological information. If result is POSITIVE SARS-CoV-2 target nucleic acids are DETECTED. The SARS-CoV-2 RNA is generally detectable in upper and lower  respiratory specimens dur ing the acute phase of infection.  Positive  results are indicative of active infection with SARS-CoV-2.  Clinical  correlation with patient history and other diagnostic information is  necessary to determine patient infection status.  Positive results do  not rule out bacterial infection or co-infection with other viruses. If result is PRESUMPTIVE POSTIVE SARS-CoV-2 nucleic acids MAY BE PRESENT.   A presumptive positive result was obtained on the submitted specimen  and confirmed on repeat testing.  While 2019 novel coronavirus  (SARS-CoV-2) nucleic acids may be present in the submitted sample  additional confirmatory testing may be necessary for epidemiological  and / or clinical management purposes  to differentiate between  SARS-CoV-2 and other Sarbecovirus currently known to infect humans.  If clinically indicated additional testing with an alternate test  methodology [phone]) is advised. The SARS-CoV-2 RNA is generally  detectable in upper and lower respiratory sp ecimens during the acute  phase of infection. The expected result is Negative. Fact Sheet for Patients:  BoilerBrush.com.cyhttps://www.fda.gov/media/136312/download Fact Sheet for Healthcare Providers: https://pope.com/https://www.fda.gov/media/136313/download This test is not yet approved or cleared by the [place] FDA and has been authorized for detection and/or diagnosis of SARS-CoV-2 by FDA under an Emergency Use Authorization (EUA).  This EUA will remain in effect (meaning this test can be  used) for the duration of the COVID-19 declaration under Section 564(b)(1) of the Act, 21 U.S.C. section 360bbb-3(b)(1), unless the authorization is terminated or revoked sooner. Performed at [facility], [address] [place], [place] [address]   CBC     Status: Abnormal   Collection Time: 12/19/18  7:40 AM  Result Value Ref Range   WBC 9.9 4.0 - 10.5 K/uL   RBC 2.59 (L) 4.22 - 5.81 MIL/uL   Hemoglobin 8.1 (L) 13.0 - 17.0 g/dL   HCT 56.225.0 (L) 13.039.0 - 86.552.0 %   MCV 96.5 80.0 - 100.0 fL   MCH 31.3 26.0 - 34.0 pg   MCHC 32.4 30.0 - 36.0 g/dL   RDW 78.413.1 69.611.5 - 29.515.5 %   Platelets 328 150 - 400 K/uL   nRBC 0.0 0.0 - 0.2 %    Comment: Performed at [facility], [address] [place], [place] [address]  Magnesium     Status: Abnormal   Collection Time: 12/19/18  7:40 AM  Result Value Ref Range   Magnesium 1.6 (L) 1.7 - 2.4 mg/dL    Comment: Performed at [facility], [address] [place], [place] [address]  Comprehensive metabolic panel     Status: Abnormal   Collection Time: 12/19/18  7:40 AM  Result Value Ref Range   Sodium 131 (L) 135 - 145 mmol/L   Potassium 3.8 3.5 - 5.1 mmol/L   Chloride 99 98 - 111 mmol/L   CO2 24 22 - 32 mmol/L   Glucose, Bld 101 (H) 70 - 99 mg/dL   BUN 23 (H) 6 - 20 mg/dL   Creatinine, Ser 5.36 0.61 - 1.24 mg/dL   Calcium 8.1 (L) 8.9 - 10.3 mg/dL   Total Protein 7.9 6.5 - 8.1 g/dL   Albumin 2.4 (L) 3.5 - 5.0 g/dL   AST 69 (H) 15 - 41 U/L   ALT 38 0 - 44 U/L   Alkaline Phosphatase 92 38 - 126 U/L   Total Bilirubin 1.3 (H) 0.3 - 1.2 mg/dL   GFR calc non Af Amer >60 >60 mL/min   GFR calc Af Amer >60 >60 mL/min   Anion gap 8 5 - 15    Comment: Performed at [facility], [address] [place], [place] [address]    ABGS No results for input(s): PHART, PO2ART, TCO2, HCO3 in the last 72 hours.  Invalid input(s): PCO2 CULTURES Recent Results (from the past 240 hour(s))  Culture, blood (routine x 2)     Status: None (Preliminary result)   Collection  Time: 12/18/18  5:49 AM   Specimen: BLOOD RIGHT ARM  Result Value Ref Range Status   Specimen Description BLOOD RIGHT ARM  Final   Special Requests   Final    BOTTLES DRAWN AEROBIC AND ANAEROBIC Blood Culture adequate volume   Culture   Final    NO GROWTH 1 DAY Performed at [facility], [address] [place], [place] [address]    Report Status PENDING  Incomplete  Culture, blood (routine x 2)     Status: None (Preliminary result)   Collection Time: 12/18/18  5:52 AM   Specimen: BLOOD RIGHT HAND  Result Value Ref Range Status   Specimen Description BLOOD RIGHT HAND  Final   Special Requests   Final    BOTTLES DRAWN AEROBIC AND ANAEROBIC Blood Culture adequate volume   Culture   Final    NO GROWTH 1 DAY Performed at [facility], [address] [place], [place] [address]    Report Status PENDING  Incomplete  SARS Coronavirus 2 [facility] order, Performed in [facility] hospital lab) Nasopharyngeal Nasopharyngeal Swab     Status: None   Collection Time: 12/18/18  6:21 AM   Specimen: Nasopharyngeal Swab  Result Value Ref Range Status   SARS Coronavirus 2 NEGATIVE NEGATIVE Final    Comment: (NOTE) If result is NEGATIVE SARS-CoV-2 target nucleic acids are NOT DETECTED. The SARS-CoV-2 RNA is generally detectable in upper and lower  respiratory specimens during the acute phase of infection. The lowest  concentration of SARS-CoV-2 viral copies this assay can detect is 250  copies / mL. A negative result does not preclude SARS-CoV-2 infection  and should not be used as the sole basis for treatment or other  patient management decisions.  A negative result may occur with  improper specimen collection / handling, submission of specimen other  than nasopharyngeal swab, presence of viral mutation(s) within the  areas targeted by this assay, and inadequate number of viral copies  (<250 copies / mL). A negative result must be combined with clinical  observations, patient history, and  epidemiological information. If result is POSITIVE SARS-CoV-2 target nucleic  acids are DETECTED. The SARS-CoV-2 RNA is generally detectable in upper and lower  respiratory specimens dur ing the acute phase of infection.  Positive  results are indicative of active infection with SARS-CoV-2.  Clinical  correlation with patient history and other diagnostic information is  necessary to determine patient infection status.  Positive results do  not rule out bacterial infection or co-infection with other viruses. If result is PRESUMPTIVE POSTIVE SARS-CoV-2 nucleic acids MAY BE PRESENT.   A presumptive positive result was obtained on the submitted specimen  and confirmed on repeat testing.  While 2019 novel coronavirus  (SARS-CoV-2) nucleic acids may be present in the submitted sample  additional confirmatory testing may be necessary for epidemiological  and / or clinical management purposes  to differentiate between  SARS-CoV-2 and other Sarbecovirus currently known to infect humans.  If clinically indicated additional testing with an alternate test  methodology [phone]) is advised. The SARS-CoV-2 RNA is generally  detectable in upper and lower respiratory sp ecimens during the acute  phase of infection. The expected result is Negative. Fact Sheet for Patients:  BoilerBrush.com.cy Fact Sheet for Healthcare Providers: https://pope.com/ This test is not yet approved or cleared by the [place] FDA and has been authorized for detection and/or diagnosis of SARS-CoV-2 by FDA under an Emergency Use Authorization (EUA).  This EUA will remain in effect (meaning this test can be used) for the duration of the COVID-19 declaration under Section 564(b)(1) of the Act, 21 U.S.C. section 360bbb-3(b)(1), unless the authorization is terminated or revoked sooner. Performed at [facility], [address], [place], [place] [address]     Studies/Results: Dg Chest 2 View  Result Date: 12/18/2018 CLINICAL DATA:  Altered level of consciousness. EXAM: CHEST - 2 VIEW COMPARISON:  None. FINDINGS: Right upper lobe airspace opacity with central lucencies. Left lung is clear. Heart is normal in size. Slight right hilar prominence. No pulmonary edema, pleural fluid, or pneumothorax. No acute osseous abnormalities are seen. IMPRESSION: Right upper lobe opacity with central lucencies, probable cavitary process, infection versus malignancy. Recommend further evaluation with chest CT, preferably with IV contrast. Electronically Signed   By: Narda Rutherford M.D.   On: 12/18/2018 01:59   Ct Head Wo Contrast  Result Date: 12/17/2018 CLINICAL DATA:  Generalized muscle weakness. Altered mental status. Dizziness. EXAM: CT HEAD WITHOUT CONTRAST TECHNIQUE: Contiguous axial images were obtained from the base of the skull through the vertex without intravenous contrast. COMPARISON:  Head CT 12/17/2016 FINDINGS: Brain: No evidence of acute infarction, hemorrhage, hydrocephalus, extra-axial collection or mass lesion/mass effect. Mild generalized atrophy. Mild chronic small vessel ischemia. 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 Vascular: No hyperdense vessel or unexpected calcification. Skull: No fracture or focal lesion. Sinuses/Orbits: Paranasal sinuses and mastoid air cells are clear. The visualized orbits are unremarkable. Other: Chronic right parietal scalp scarring. IMPRESSION: 1. No acute intracranial abnormality. 2. Mild generalized atrophy and chronic small vessel ischemia, slightly advanced for age. Electronically Signed   By: Narda Rutherford M.D.   On: 12/17/2018 23:12   Ct Chest W Contrast  Result Date: 12/18/2018 CLINICAL DATA:  53 year old male with dizziness. EXAM: CT CHEST WITH CONTRAST TECHNIQUE: Multidetector CT imaging of the chest was performed during intravenous contrast administration. CONTRAST:   75mL OMNIPAQUE IOHEXOL 300 MG/ML  SOLN COMPARISON:  Chest radiograph dated 12/18/2018 FINDINGS: Cardiovascular: There is no cardiomegaly or pericardial effusion. The thoracic aorta is unremarkable. The origins of the great vessels of the aortic  arch appear patent. The central pulmonary arteries are grossly unremarkable for the degree of opacification. Mediastinum/Nodes: No hilar or mediastinal adenopathy. The esophagus and the thyroid gland are grossly unremarkable. No mediastinal fluid collection. Lungs/Pleura: There is a 3.3 x 3.0 cm cavitary lesion in the right upper lobe with ground-glass and nodular density in the adjacent lung parenchyma. Findings may represent fungal infection, abscess, TB. Malignancy is not excluded. Clinical correlation and follow-up to resolution after treatment is recommended. The left lung is clear. Upper Abdomen: Fatty infiltration of the liver. The visualized upper abdomen is otherwise unremarkable. Musculoskeletal: No chest wall abnormality. No acute or significant osseous findings. IMPRESSION: Cavitary lesion in the right upper lobe with ground-glass and nodular density in the adjacent lung parenchyma. Findings may represent fungal infection, abscess, TB. Malignancy is not excluded. Clinical correlation and follow-up to resolution after treatment is recommended. Electronically Signed   By: Elgie Collard M.D.   On: 12/18/2018 03:21   US Abdomen Limited Ruq  Result Date: 12/18/2018 CLINICAL DATA:  Abnormal LFTs EXAM: ULTRASOUND ABDOMEN LIMITED RIGHT UPPER QUADRANT COMPARISON:  None Correlation CT chest 12/18/2018 FINDINGS: Gallbladder: Incompletely distended. Gallbladder wall appears thickened. No shadowing calculi or pericholecystic fluid. No sonographic Murphy sign. Common bile duct: Diameter: 6 mm, upper normal Liver: Echogenic parenchyma, likely fatty infiltration though this can be seen with cirrhosis and certain infiltrative disorders. 7 x 6 x 11 mm hyperechoic focus  within RIGHT lobe of liver without shadowing nonspecific but question tiny hemangioma. This is questionably visualized as a hypoechoic nodule on the CT chest exam series 2, image 120. Portal vein is patent on color Doppler imaging with normal direction of blood flow towards the liver. Other: No RIGHT upper quadrant free fluid. IMPRESSION: Nonspecific mild wall thickening of the gallbladder without evidence of gallstones or pericholecystic fluid. Probable mild fatty infiltration of liver. Question 11 mm hemangioma RIGHT lobe liver, questionably visualized as slightly low-attenuation nodule on CT. Electronically Signed   By: Ulyses Southward M.D.   On: 12/18/2018 10:47    Medications:  Prior to Admission:  No medications prior to admission.   Scheduled: . enoxaparin (LOVENOX) injection  40 mg Subcutaneous Q24H  . feeding supplement (PRO-STAT SUGAR FREE 64)  30 mL Oral BID  . folic acid  1 mg Oral Daily  . LORazepam  0-4 mg Intravenous Q6H   Followed by  . [START ON 12/20/2018] LORazepam  0-4 mg Intravenous Q12H  . multivitamin with minerals  1 tablet Oral Daily  . thiamine  100 mg Oral Daily   Or  . thiamine  100 mg Intravenous Daily   Continuous: . ampicillin-sulbactam (UNASYN) IV 3 g (12/19/18 0541)   WUJ:[id number] **OR** acetaminophen, LORazepam **OR** LORazepam  Assesment: He has a cavitary lung lesion likely lung abscess.  Await AFBs Principal Problem:   Cavitary lesion of lung Active Problems:   Abnormal liver function   Hyponatremia   ARF (acute renal failure) (HCC)    Plan: Continue current treatments.    LOS: 1 day   Fredirick Maudlin 12/19/2018, 8:42 AM

## 2018-12-20 LAB — T3, FREE: T3, Free: 2.7 pg/mL (ref 2.0–4.4)

## 2018-12-20 NOTE — Progress Notes (Signed)
PROGRESS NOTE    Jared Pope  NWG:[id number] DOB: [birth year] DOA: 12/17/2018 PCP: Patient, [name]   Brief Narrative:  Per HPI: Patient is a 53 y.o. male with history of alcohol abuse, HTN-currently incarcerated at a local prison for almost 2 weeks-presented to the hospital for weakness and dizziness, further evaluation revealed a right upper lobe lung cavitary lesion.  Subsequently admitted for further evaluation and treatment. -Was admitted remained stable, for work-up of the cavitary lesion ruling out TB versus infection.  Pulmonary team consulted, following closely.  9/26: Patient is being treated for cavitary lung lesion that appears to be a lung abscess.  Continuing Unasyn.  He appears to be doing well.  AFB smear from 9/24 has returned negative.   Assessment & Plan:   Principal Problem:   Cavitary lesion of lung Active Problems:   Abnormal liver function   Hyponatremia   ARF (acute renal failure) (HCC)   Right upper lung cavitary lesion, suspect abscess -Continue current antibiotics with Unasyn -Continue to monitor AFB x3 and monitor and airborne isolation -Appreciate pulmonology input  History of alcohol abuse -CIWA protocol -Has been incarcerated for 2 weeks without withdrawal  AKI -Repeat labs in a.m. and monitor  Hypokalemia/hypomagnesemia -Repeat a.m. labs and replete and monitor  HCV -Follow HCVRNA   DVT prophylaxis: Lovenox Code Status: Full Family Communication: None at bedside Disposition Plan: Continue current antibiotic treatment as ordered.  Appreciate pulmonology recommendations.   Consultants:   Pulmonology  Procedures:   None  Antimicrobials:  Anti-infectives (From admission, onward)   Start     Dose/Rate Route Frequency Ordered Stop   12/18/18 0530  Ampicillin-Sulbactam (UNASYN) 3 g in sodium chloride 0.9 % 100 mL IVPB     3 g 200 mL/hr over 30 Minutes Intravenous Every 6 hours 12/18/18 0508         Subjective:  Patient seen and evaluated today with no new acute complaints or concerns. No acute concerns or events noted overnight.  He appears to be doing well and has had his breakfast this morning.  He denies any shortness of breath, fevers, chills, or night sweats.  Objective: Vitals:   12/19/18 1203 12/19/18 1327 12/19/18 1700 12/20/18 0018  BP: 111/69 118/77 108/68 108/73  Pulse: 88 95 (!) 103 86  Resp: Temp: 98.5 F (36.9 C) (!) 100.4 F (38 C) 100 F (37.8 C) 98.9 F (37.2 C)  TempSrc:  Oral Oral Oral  SpO2: 100% 100% 100% 100%  Weight:      Height:        Intake/Output Summary (Last 24 hours) at 12/20/2018 1101 Last data filed at 12/20/2018 0900 Gross per 24 hour  Intake 240 ml  Output -  Net 240 ml   Filed Weights   12/17/18 1526 12/18/18 2300  Weight: 66.7 kg 60.7 kg    Examination:  General exam: Appears calm and comfortable, thin Respiratory system: Clear to auscultation. Respiratory effort normal. Cardiovascular system: S1 & S2 heard, RRR. No JVD, murmurs, rubs, gallops or clicks. No pedal edema. Gastrointestinal system: Abdomen is nondistended, soft and nontender. No organomegaly or masses felt. Normal bowel sounds heard. Central nervous system: Alert and oriented. No focal neurological deficits. Extremities: Symmetric 5 x 5 power. Skin: No rashes, lesions or ulcers Psychiatry: Judgement and insight appear normal. Mood & affect appropriate.     Data Reviewed: I have personally reviewed following labs and imaging studies  CBC: Recent Labs  Lab 12/17/18 2303 12/18/18  [id number] 12/18/18 0755 12/19/18 0740  WBC 13.6* 14.0*  --  9.9  NEUTROABS 9.2*  --   --   --   HGB 10.2* 8.4*  --  8.1*  HCT 32.7* 26.3* 24.6* 25.0*  MCV 98.8 97.4  --  96.5  PLT 370 350  --  328   Basic Metabolic Panel: Recent Labs  Lab 12/17/18 2303 12/18/18 0552 12/19/18 0740  NA 133* 133* 131*  K 3.7 3.6 3.8  CL 96* 98 99  CO2 25 26 24   GLUCOSE 102* 107* 101*  BUN 29* 30*  23*  CREATININE 1.31* 1.17 1.02  CALCIUM 8.9 8.1* 8.1*  MG  --  1.3* 1.6*  PHOS  --  3.0  --    GFR: Estimated Creatinine Clearance: 70.3 mL/min (by C-G formula based on SCr of 1.02 mg/dL). Liver Function Tests: Recent Labs  Lab 12/17/18 2303 12/18/18 0552 12/19/18 0740  AST 84* 73* 69*  ALT 48* 41 38  ALKPHOS 98 91 92  BILITOT 1.7* 1.4* 1.3*  PROT 9.6* 8.1 7.9  ALBUMIN 3.1* 2.6* 2.4*   No results for input(s): LIPASE, AMYLASE in the last 168 hours. Recent Labs  Lab 12/17/18 2303  AMMONIA 20   Coagulation Profile: No results for input(s): INR, PROTIME in the last 168 hours. Cardiac Enzymes: No results for input(s): CKTOTAL, CKMB, CKMBINDEX, TROPONINI in the last 168 hours. BNP (last 3 results) No results for input(s): PROBNP in the last 8760 hours. HbA1C: No results for input(s): HGBA1C in the last 72 hours. CBG: No results for input(s): GLUCAP in the last 168 hours. Lipid Profile: No results for input(s): CHOL, HDL, LDLCALC, TRIG, CHOLHDL, LDLDIRECT in the last 72 hours. Thyroid Function Tests: Recent Labs    12/17/18 2303 12/19/18 0745  TSH 5.286*  --   FREET4  --  1.23*  T3FREE  --  2.7   Anemia Panel: Recent Labs    12/18/18 0549  VITAMINB12 2,605*  FERRITIN 590*  TIBC 280  IRON 30*   Sepsis Labs: No results for input(s): PROCALCITON, LATICACIDVEN in the last 168 hours.  Recent Results (from the past 240 hour(s))  Culture, blood (routine x 2)     Status: None (Preliminary result)   Collection Time: 12/18/18  5:49 AM   Specimen: BLOOD RIGHT ARM  Result Value Ref Range Status   Specimen Description BLOOD RIGHT ARM  Final   Special Requests   Final    BOTTLES DRAWN AEROBIC AND ANAEROBIC Blood Culture adequate volume   Culture   Final    NO GROWTH 2 DAYS Performed at [facility], [address] [place], [place] [address]    Report Status PENDING  Incomplete  Culture, blood (routine x 2)     Status: None (Preliminary result)   Collection Time:  12/18/18  5:52 AM   Specimen: BLOOD RIGHT HAND  Result Value Ref Range Status   Specimen Description BLOOD RIGHT HAND  Final   Special Requests   Final    BOTTLES DRAWN AEROBIC AND ANAEROBIC Blood Culture adequate volume   Culture   Final    NO GROWTH 2 DAYS Performed at [facility], [address] [place], [place] [address]    Report Status PENDING  Incomplete  SARS Coronavirus 2 [facility](Hospital order, Performed in [facility] hospital lab) Nasopharyngeal Nasopharyngeal Swab     Status: None   Collection Time: 12/18/18  6:21 AM   Specimen: Nasopharyngeal Swab  Result Value Ref Range Status   SARS  Coronavirus 2 NEGATIVE NEGATIVE Final    Comment: (NOTE) If result is NEGATIVE SARS-CoV-2 target nucleic acids are NOT DETECTED. The SARS-CoV-2 RNA is generally detectable in upper and lower  respiratory specimens during the acute phase of infection. The lowest  concentration of SARS-CoV-2 viral copies this assay can detect is 250  copies / mL. A negative result does not preclude SARS-CoV-2 infection  and should not be used as the sole basis for treatment or other  patient management decisions.  A negative result may occur with  improper specimen collection / handling, submission of specimen other  than nasopharyngeal swab, presence of viral mutation(s) within the  areas targeted by this assay, and inadequate number of viral copies  (<250 copies / mL). A negative result must be combined with clinical  observations, patient history, and epidemiological information. If result is POSITIVE SARS-CoV-2 target nucleic acids are DETECTED. The SARS-CoV-2 RNA is generally detectable in upper and lower  respiratory specimens dur ing the acute phase of infection.  Positive  results are indicative of active infection with SARS-CoV-2.  Clinical  correlation with patient history and other diagnostic information is  necessary to determine patient infection status.  Positive results do  not rule out  bacterial infection or co-infection with other viruses. If result is PRESUMPTIVE POSTIVE SARS-CoV-2 nucleic acids MAY BE PRESENT.   A presumptive positive result was obtained on the submitted specimen  and confirmed on repeat testing.  While 2019 novel coronavirus  (SARS-CoV-2) nucleic acids may be present in the submitted sample  additional confirmatory testing may be necessary for epidemiological  and / or clinical management purposes  to differentiate between  SARS-CoV-2 and other Sarbecovirus currently known to infect humans.  If clinically indicated additional testing with an alternate test  methodology [phone]) is advised. The SARS-CoV-2 RNA is generally  detectable in upper and lower respiratory sp ecimens during the acute  phase of infection. The expected result is Negative. Fact Sheet for Patients:  BoilerBrush.com.cy Fact Sheet for Healthcare Providers: https://pope.com/ This test is not yet approved or cleared by the [place] FDA and has been authorized for detection and/or diagnosis of SARS-CoV-2 by FDA under an Emergency Use Authorization (EUA).  This EUA will remain in effect (meaning this test can be used) for the duration of the COVID-19 declaration under Section 564(b)(1) of the Act, 21 U.S.C. section 360bbb-3(b)(1), unless the authorization is terminated or revoked sooner. Performed at [facility], [address] [place], [place] [address]   Acid Fast Smear (AFB)     Status: None   Collection Time: 12/18/18 10:29 AM   Specimen: Sputum  Result Value Ref Range Status   AFB Specimen Processing Concentration  Final   Acid Fast Smear Negative  Final    Comment: (NOTE) Performed At: [facility] [address] [place], [place] [id number] Jolene Schimke MD XT:[id number]    Source (AFB) SPUTUM  Final    Comment: Performed at [facility], [address], [place], [place] [address]         Radiology  Studies: No results found.      Scheduled Meds: . enoxaparin (LOVENOX) injection  40 mg Subcutaneous Q24H  . feeding supplement (PRO-STAT SUGAR FREE 64)  30 mL Oral BID  . folic acid  1 mg Oral Daily  . influenza vac split quadrivalent PF  0.5 mL Intramuscular Tomorrow-1000  . LORazepam  0-4 mg Intravenous Q12H  . multivitamin with minerals  1 tablet Oral Daily  . thiamine  100 mg Oral Daily   Or  . thiamine  100 mg Intravenous Daily   Continuous Infusions: . ampicillin-sulbactam (UNASYN) IV 3 g (12/20/18 9379)     LOS: 2 days    Time spent: 30 minutes    Marleah Beever Darleen Crocker, DO [facility] Pager [phone]  If 7PM-7AM, please contact night-coverage [email or web address] Password [email or web address] 12/20/2018, 11:01 AM

## 2018-12-20 NOTE — Progress Notes (Signed)
Subjective: He says he feels okay.  No new complaints.  AFB smear on 924 was negative. Objective: Vital signs in last 24 hours: Temp:  [98.5 F (36.9 C)-100.4 F (38 C)] 98.9 F (37.2 C) (09/26 0018) Pulse Rate:  [86-103] 86 (09/26 0018) Resp:  [16-20] 16 (09/25 1700) BP: (106-118)/(64-77) 108/73 (09/26 0018) SpO2:  [100 %] 100 % (09/26 0018) Weight change:  Last BM Date: 12/18/18  Intake/Output from previous day: No intake/output data recorded.  PHYSICAL EXAM General appearance: alert, cooperative and no distress Resp: rhonchi bilaterally Cardio: regular rate and rhythm, S1, S2 normal, no murmur, click, rub or gallop GI: soft, non-tender; bowel sounds normal; no masses,  no organomegaly Extremities: extremities normal, atraumatic, no cyanosis or edema  Lab Results:  Results for orders placed or performed during the hospital encounter of 12/17/18 (from the past 48 hour(s))  Acid Fast Smear (AFB)     Status: None   Collection Time: 12/18/18 10:29 AM   Specimen: Sputum  Result Value Ref Range   AFB Specimen Processing Concentration    Acid Fast Smear Negative     Comment: (NOTE) Performed At: [facility] [address] [place], [place] [id number] Jolene Schimke MD SP:[id number]    Source (AFB) SPUTUM     Comment: Performed at [facility], [address] [place], [place] [address]  CBC     Status: Abnormal   Collection Time: 12/19/18  7:40 AM  Result Value Ref Range   WBC 9.9 4.0 - 10.5 K/uL   RBC 2.59 (L) 4.22 - 5.81 MIL/uL   Hemoglobin 8.1 (L) 13.0 - 17.0 g/dL   HCT 56.2 (L) 56.3 - 89.3 %   MCV 96.5 80.0 - 100.0 fL   MCH 31.3 26.0 - 34.0 pg   MCHC 32.4 30.0 - 36.0 g/dL   RDW 73.4 28.7 - 68.1 %   Platelets 328 150 - 400 K/uL   nRBC 0.0 0.0 - 0.2 %    Comment: Performed at [facility], [address], [place], [place] [address]  Magnesium     Status: Abnormal   Collection Time: 12/19/18  7:40 AM  Result Value Ref Range   Magnesium 1.6 (L) 1.7 - 2.4  mg/dL    Comment: Performed at [facility], [address] [place], [place] [address]  Comprehensive metabolic panel     Status: Abnormal   Collection Time: 12/19/18  7:40 AM  Result Value Ref Range   Sodium 131 (L) 135 - 145 mmol/L   Potassium 3.8 3.5 - 5.1 mmol/L   Chloride 99 98 - 111 mmol/L   CO2 24 22 - 32 mmol/L   Glucose, Bld 101 (H) 70 - 99 mg/dL   BUN 23 (H) 6 - 20 mg/dL   Creatinine, Ser 9.74 0.61 - 1.24 mg/dL   Calcium 8.1 (L) 8.9 - 10.3 mg/dL   Total Protein 7.9 6.5 - 8.1 g/dL   Albumin 2.4 (L) 3.5 - 5.0 g/dL   AST 69 (H) 15 - 41 U/L   ALT 38 0 - 44 U/L   Alkaline Phosphatase 92 38 - 126 U/L   Total Bilirubin 1.3 (H) 0.3 - 1.2 mg/dL   GFR calc non Af Amer >60 >60 mL/min   GFR calc Af Amer >60 >60 mL/min   Anion gap 8 5 - 15    Comment: Performed at [facility], [address] [place], [place] [address]  T4, free     Status: Abnormal   Collection Time: 12/19/18  7:45 AM  Result Value Ref Range   Free T4 1.23 (H) 0.61 - 1.12 ng/dL    Comment: (NOTE) Biotin ingestion may interfere with free T4 tests. If the results are inconsistent with the TSH level, previous test results, or the clinical presentation, then consider biotin interference. If needed, order repeat testing after stopping biotin. Performed at [facility], [place] [address] [place], [place] [address]   T3, free     Status: None   Collection Time: 12/19/18  7:45 AM  Result Value Ref Range   T3, Free 2.7 2.0 - 4.4 pg/mL    Comment: (NOTE) Performed At: [facility] [place], [place] [id number] Rush Farmer MD WG:[id number]     ABGS No results for input(s): PHART, PO2ART, TCO2, HCO3 in the last 72 hours.  Invalid input(s): PCO2 CULTURES Recent Results (from the past 240 hour(s))  Culture, blood (routine x 2)     Status: None (Preliminary result)   Collection Time: 12/18/18  5:49 AM   Specimen: BLOOD RIGHT ARM  Result Value Ref Range Status   Specimen Description  BLOOD RIGHT ARM  Final   Special Requests   Final    BOTTLES DRAWN AEROBIC AND ANAEROBIC Blood Culture adequate volume   Culture   Final    NO GROWTH 2 DAYS Performed at [facility], [address], [place], [place] [address]    Report Status PENDING  Incomplete  Culture, blood (routine x 2)     Status: None (Preliminary result)   Collection Time: 12/18/18  5:52 AM   Specimen: BLOOD RIGHT HAND  Result Value Ref Range Status   Specimen Description BLOOD RIGHT HAND  Final   Special Requests   Final    BOTTLES DRAWN AEROBIC AND ANAEROBIC Blood Culture adequate volume   Culture   Final    NO GROWTH 2 DAYS Performed at [facility], [address], [place], [place] [address]    Report Status PENDING  Incomplete  SARS Coronavirus 2 [facility] order, Performed in [place] hospital lab) Nasopharyngeal Nasopharyngeal Swab     Status: None   Collection Time: 12/18/18  6:21 AM   Specimen: Nasopharyngeal Swab  Result Value Ref Range Status   SARS Coronavirus 2 NEGATIVE NEGATIVE Final    Comment: (NOTE) If result is NEGATIVE SARS-CoV-2 target nucleic acids are NOT DETECTED. The SARS-CoV-2 RNA is generally detectable in upper and lower  respiratory specimens during the acute phase of infection. The lowest  concentration of SARS-CoV-2 viral copies this assay can detect is 250  copies / mL. A negative result does not preclude SARS-CoV-2 infection  and should not be used as the sole basis for treatment or other  patient management decisions.  A negative result may occur with  improper specimen collection / handling, submission of specimen other  than nasopharyngeal swab, presence of viral mutation(s) within the  areas targeted by this assay, and inadequate number of viral copies  (<250 copies / mL). A negative result must be combined with clinical  observations, patient history, and epidemiological information. If result is POSITIVE SARS-CoV-2 target nucleic acids are DETECTED. The  SARS-CoV-2 RNA is generally detectable in upper and lower  respiratory specimens dur ing the acute phase of infection.  Positive  results are indicative of active infection with SARS-CoV-2.  Clinical  correlation with patient history and other diagnostic information is  necessary to determine patient infection status.  Positive results do  not rule out bacterial infection or  co-infection with other viruses. If result is PRESUMPTIVE POSTIVE SARS-CoV-2 nucleic acids MAY BE PRESENT.   A presumptive positive result was obtained on the submitted specimen  and confirmed on repeat testing.  While 2019 novel coronavirus  (SARS-CoV-2) nucleic acids may be present in the submitted sample  additional confirmatory testing may be necessary for epidemiological  and / or clinical management purposes  to differentiate between  SARS-CoV-2 and other Sarbecovirus currently known to infect humans.  If clinically indicated additional testing with an alternate test  methodology [id number]) is advised. The SARS-CoV-2 RNA is generally  detectable in upper and lower respiratory sp ecimens during the acute  phase of infection. The expected result is Negative. Fact Sheet for Patients:  BoilerBrush.com.cy Fact Sheet for Healthcare Providers: https://pope.com/ This test is not yet approved or cleared by the [place] FDA and has been authorized for detection and/or diagnosis of SARS-CoV-2 by FDA under an Emergency Use Authorization (EUA).  This EUA will remain in effect (meaning this test can be used) for the duration of the COVID-19 declaration under Section 564(b)(1) of the Act, 21 U.S.C. section 360bbb-3(b)(1), unless the authorization is terminated or revoked sooner. Performed at [facility], [address], [place], [place] [address]   Acid Fast Smear (AFB)     Status: None   Collection Time: 12/18/18 10:29 AM   Specimen: Sputum  Result Value Ref Range  Status   AFB Specimen Processing Concentration  Final   Acid Fast Smear Negative  Final    Comment: (NOTE) Performed At: [facility] [address] [place], [place] [id number] Jolene Schimke MD NF:[id number]    Source (AFB) SPUTUM  Final    Comment: Performed at [facility], [address] [place], [place] [address]   Studies/Results: US Abdomen Limited Ruq  Result Date: 12/18/2018 CLINICAL DATA:  Abnormal LFTs EXAM: ULTRASOUND ABDOMEN LIMITED RIGHT UPPER QUADRANT COMPARISON:  None Correlation CT chest 12/18/2018 FINDINGS: Gallbladder: Incompletely distended. Gallbladder wall appears thickened. No shadowing calculi or pericholecystic fluid. No sonographic Murphy sign. Common bile duct: Diameter: 6 mm, upper normal Liver: Echogenic parenchyma, likely fatty infiltration though this can be seen with cirrhosis and certain infiltrative disorders. 7 x 6 x 11 mm hyperechoic focus within RIGHT lobe of liver without shadowing nonspecific but question tiny hemangioma. This is questionably visualized as a hypoechoic nodule on the CT chest exam series 2, image 120. Portal vein is patent on color Doppler imaging with normal direction of blood flow towards the liver. Other: No RIGHT upper quadrant free fluid. IMPRESSION: Nonspecific mild wall thickening of the gallbladder without evidence of gallstones or pericholecystic fluid. Probable mild fatty infiltration of liver. Question 11 mm hemangioma RIGHT lobe liver, questionably visualized as slightly low-attenuation nodule on CT. Electronically Signed   By: Ulyses Southward M.D.   On: 12/18/2018 10:47    Medications:  Prior to Admission:  No medications prior to admission.   Scheduled: . enoxaparin (LOVENOX) injection  40 mg Subcutaneous Q24H  . feeding supplement (PRO-STAT SUGAR FREE 64)  30 mL Oral BID  . folic acid  1 mg Oral Daily  . influenza vac split quadrivalent PF  0.5 mL Intramuscular Tomorrow-1000  . LORazepam  0-4 mg Intravenous Q12H  .  multivitamin with minerals  1 tablet Oral Daily  . thiamine  100 mg Oral Daily   Or  . thiamine  100 mg Intravenous Daily   Continuous: . ampicillin-sulbactam (UNASYN) IV 3 g (12/20/18 9528)   UXL:[id number] **OR** acetaminophen, LORazepam **  OR** LORazepam  Assesment: He was admitted with cavitary lesion in his lung.  This is most likely lung abscess but he is also high risk for tuberculosis.  He is in isolation and first AFB smear is negative.  He is being treated for lung abscess.  He had acute renal failure which is improved.  He is anemic and looks like he may be iron deficient  He is hyponatremic likely from his pulmonary problem Principal Problem:   Cavitary lesion of lung Active Problems:   Abnormal liver function   Hyponatremia   ARF (acute renal failure) (HCC)    Plan: Continue antibiotics.  Continue obtaining sputum.    LOS: 2 days   Fredirick Maudlindward L Tyrese Ficek 12/20/2018, 9:19 AM

## 2018-12-21 LAB — CBC
HCT: 26.2 % — ABNORMAL LOW (ref 39.0–52.0)
Hemoglobin: 8.4 g/dL — ABNORMAL LOW (ref 13.0–17.0)
MCH: 31 pg (ref 26.0–34.0)
MCHC: 32.1 g/dL (ref 30.0–36.0)
MCV: 96.7 fL (ref 80.0–100.0)
Platelets: 341 10*3/uL (ref 150–400)
RBC: 2.71 MIL/uL — ABNORMAL LOW (ref 4.22–5.81)
RDW: 13.2 % (ref 11.5–15.5)
WBC: 8.7 10*3/uL (ref 4.0–10.5)
nRBC: 0 % (ref 0.0–0.2)

## 2018-12-21 LAB — COMPREHENSIVE METABOLIC PANEL
ALT: 33 U/L (ref 0–44)
AST: 62 U/L — ABNORMAL HIGH (ref 15–41)
Albumin: 2.4 g/dL — ABNORMAL LOW (ref 3.5–5.0)
Alkaline Phosphatase: 94 U/L (ref 38–126)
Anion gap: 7 (ref 5–15)
BUN: 17 mg/dL (ref 6–20)
CO2: 23 mmol/L (ref 22–32)
Calcium: 8.4 mg/dL — ABNORMAL LOW (ref 8.9–10.3)
Chloride: 103 mmol/L (ref 98–111)
Creatinine, Ser: 1 mg/dL (ref 0.61–1.24)
GFR calc Af Amer: >60 mL/min (ref >60)
GFR calc non Af Amer: >60 mL/min (ref >60)
Glucose, Bld: 98 mg/dL (ref 70–99)
Potassium: 3.8 mmol/L (ref 3.5–5.1)
Sodium: 133 mmol/L — ABNORMAL LOW (ref 135–145)
Total Bilirubin: 1.1 mg/dL (ref 0.3–1.2)
Total Protein: 8.1 g/dL (ref 6.5–8.1)

## 2018-12-21 LAB — MAGNESIUM: Magnesium: 1.4 mg/dL — ABNORMAL LOW (ref 1.7–2.4)

## 2018-12-21 LAB — HEPATITIS C VRS RNA DETECT BY PCR-QUAL: Hepatitis C Vrs RNA by PCR-Qual: POSITIVE — AB

## 2018-12-21 MED ORDER — MAGNESIUM SULFATE 2 GM/50ML IV SOLN
2.0000 g | Freq: Once | INTRAVENOUS | Status: AC
  Administered 2018-12-21: 2 g via INTRAVENOUS
  Filled 2018-12-21: qty 50

## 2018-12-21 NOTE — Plan of Care (Signed)

## 2018-12-21 NOTE — Progress Notes (Signed)
Pharmacy Antibiotic Note   Today is day #4 of Unasyn therapy for this 53 yo male with cavitary lesion of RU lung. Patient is currently afebrile and cultures have shown no growth to date.  Renal function remains stable.  Plan: Continue Unasyn 3gm IV q6h F/U cxs and clinical progress Monitor V/S, labs  Height: 5\' 7"  (170.2 cm) Weight: 137 lb 5.6 oz (62.3 kg) IBW/kg (Calculated) : 66.1  Temp (24hrs), Avg:98.7 F (37.1 C), Min:97.7 F (36.5 C), Max:101.1 F (38.4 C)  Recent Labs  Lab 12/17/18 2303 12/18/18 0552 12/19/18 0740 12/21/18 0656  WBC 13.6* 14.0* 9.9 8.7  CREATININE 1.31* 1.17 1.02 1.00    Estimated Creatinine Clearance: 73.5 mL/min (by C-G formula based on SCr of 1 mg/dL).    No Known Allergies  Antimicrobials this admission: Unasyn 9/24 >>   Microbiology results: 9/24 [facility] x2: NG x3 days 9/24 AFB smears Sputum: neg 9/24 SARS CV 2- negative  Thank you for allowing pharmacy to participate in this patient's care.  Despina Pole, Pharm. D. Clinical Pharmacist 12/21/2018 1:37 PM

## 2018-12-21 NOTE — Progress Notes (Signed)
PROGRESS NOTE    BRANSEN FASSNACHT  ERX:[id number] DOB: 1965/05/09 DOA: 12/17/2018 PCP: Patient, [name]   Brief Narrative:  Per HPI: Patient is a13 y.o.malewith history of alcohol abuse, HTN-currently incarcerated at a local prison for almost 2 weeks-presented to the hospital for weakness and dizziness, further evaluation revealed a right upper lobe lung cavitary lesion. Subsequently admitted for further evaluation and treatment. -Was admitted remained stable, for work-up of the cavitary lesion ruling out TB versus infection. Pulmonary team consulted, following closely.  9/26: Patient is being treated for cavitary lung lesion that appears to be a lung abscess.  Continuing Unasyn.  He appears to be doing well.  AFB smear from 9/24 has returned negative.  9/27: Patient appears to be doing well this morning and did have a slight temperature elevation overnight.  He denies any shortness of breath or chest pain.  Assessment & Plan:   Principal Problem:   Cavitary lesion of lung Active Problems:   Abnormal liver function   Hyponatremia   ARF (acute renal failure) (HCC)   Right upper lung cavitary lesion, suspect abscess -Continue current antibiotics with Unasyn -Continue to monitor AFB x3 and monitor and airborne isolation -Appreciate pulmonology input with full set of AFB sputum smears still pending  History of alcohol abuse -CIWA protocol -Has been incarcerated for 2 weeks without withdrawal  AKI-improved -Repeat labs in a.m. and monitor  Hypomagnesemia -Replete and monitor a.m. magnesium and BMP  HCV -Follow HCVRNA which is pending   DVT prophylaxis: Lovenox Code Status: Full Family Communication: None at bedside Disposition Plan: Continue current antibiotic treatment as ordered.  Appreciate pulmonology recommendations.  Continue isolation for now and obtain further AFB smears to confirm TB status.   Consultants:   Pulmonology  Procedures:    None  Antimicrobials:  Anti-infectives (From admission, onward)   Start     Dose/Rate Route Frequency Ordered Stop   12/18/18 0530  Ampicillin-Sulbactam (UNASYN) 3 g in sodium chloride 0.9 % 100 mL IVPB     3 g 200 mL/hr over 30 Minutes Intravenous Every 6 hours 12/18/18 0508         Subjective: Patient seen and evaluated today with no new acute complaints or concerns.  He was noted to have mild temperature elevation overnight, but does not appear to be short of breath or having any chest pain.  He is getting ready to shower this morning.  Objective: Vitals:   12/21/18 0000 12/21/18 0500 12/21/18 0514 12/21/18 0739  BP:   134/83 108/74  Pulse:   66 82  Resp:   20 16  Temp: 98.1 F (36.7 C)  97.8 F (36.6 C) 97.7 F (36.5 C)  TempSrc: Oral   Oral  SpO2:   100% 100%  Weight:  62.3 kg    Height:        Intake/Output Summary (Last 24 hours) at 12/21/2018 1000 Last data filed at 12/21/2018 0700 Gross per 24 hour  Intake 1960 ml  Output -  Net 1960 ml   Filed Weights   12/17/18 1526 12/18/18 2300 12/21/18 0500  Weight: 66.7 kg 60.7 kg 62.3 kg    Examination:  General exam: Appears calm and comfortable, thin Respiratory system: Clear to auscultation. Respiratory effort normal.  Currently on room air Cardiovascular system: S1 & S2 heard, RRR. No JVD, murmurs, rubs, gallops or clicks. No pedal edema. Gastrointestinal system: Abdomen is nondistended, soft and nontender. No organomegaly or masses felt. Normal bowel sounds heard. Central nervous system:  Alert and oriented. No focal neurological deficits. Extremities: Symmetric 5 x 5 power. Skin: No rashes, lesions or ulcers Psychiatry: Judgement and insight appear normal. Mood & affect appropriate.     Data Reviewed: I have personally reviewed following labs and imaging studies  CBC: Recent Labs  Lab 12/17/18 2303 12/18/18 0552 12/18/18 0755 12/19/18 0740 12/21/18 0656  WBC 13.6* 14.0*  --  9.9 8.7  NEUTROABS 9.2*   --   --   --   --   HGB 10.2* 8.4*  --  8.1* 8.4*  HCT 32.7* 26.3* 24.6* 25.0* 26.2*  MCV 98.8 97.4  --  96.5 96.7  PLT 370 350  --  328 341   Basic Metabolic Panel: Recent Labs  Lab 12/17/18 2303 12/18/18 0552 12/19/18 0740 12/21/18 0656  NA 133* 133* 131* 133*  K 3.7 3.6 3.8 3.8  CL 96* 98 99 103  CO2 25 26 24 23   GLUCOSE 102* 107* 101* 98  BUN 29* 30* 23* 17  CREATININE 1.31* 1.17 1.02 1.00  CALCIUM 8.9 8.1* 8.1* 8.4*  MG  --  1.3* 1.6* 1.4*  PHOS  --  3.0  --   --    GFR: Estimated Creatinine Clearance: 73.5 mL/min (by C-G formula based on SCr of 1 mg/dL). Liver Function Tests: Recent Labs  Lab 12/17/18 2303 12/18/18 0552 12/19/18 0740 12/21/18 0656  AST 84* 73* 69* 62*  ALT 48* 41 38 33  ALKPHOS 98 91 92 94  BILITOT 1.7* 1.4* 1.3* 1.1  PROT 9.6* 8.1 7.9 8.1  ALBUMIN 3.1* 2.6* 2.4* 2.4*   No results for input(s): LIPASE, AMYLASE in the last 168 hours. Recent Labs  Lab 12/17/18 2303  AMMONIA 20   Coagulation Profile: No results for input(s): INR, PROTIME in the last 168 hours. Cardiac Enzymes: No results for input(s): CKTOTAL, CKMB, CKMBINDEX, TROPONINI in the last 168 hours. BNP (last 3 results) No results for input(s): PROBNP in the last 8760 hours. HbA1C: No results for input(s): HGBA1C in the last 72 hours. CBG: No results for input(s): GLUCAP in the last 168 hours. Lipid Profile: No results for input(s): CHOL, HDL, LDLCALC, TRIG, CHOLHDL, LDLDIRECT in the last 72 hours. Thyroid Function Tests: Recent Labs    12/19/18 0745  FREET4 1.23*  T3FREE 2.7   Anemia Panel: No results for input(s): VITAMINB12, FOLATE, FERRITIN, TIBC, IRON, RETICCTPCT in the last 72 hours. Sepsis Labs: No results for input(s): PROCALCITON, LATICACIDVEN in the last 168 hours.  Recent Results (from the past 240 hour(s))  Culture, blood (routine x 2)     Status: None (Preliminary result)   Collection Time: 12/18/18  5:49 AM   Specimen: BLOOD RIGHT ARM  Result Value  Ref Range Status   Specimen Description BLOOD RIGHT ARM  Final   Special Requests   Final    BOTTLES DRAWN AEROBIC AND ANAEROBIC Blood Culture adequate volume   Culture   Final    NO GROWTH 3 DAYS Performed at [facility], [address] [place], [place] [address]    Report Status PENDING  Incomplete  Culture, blood (routine x 2)     Status: None (Preliminary result)   Collection Time: 12/18/18  5:52 AM   Specimen: BLOOD RIGHT HAND  Result Value Ref Range Status   Specimen Description BLOOD RIGHT HAND  Final   Special Requests   Final    BOTTLES DRAWN AEROBIC AND ANAEROBIC Blood Culture adequate volume   Culture   Final    NO  GROWTH 3 DAYS Performed at [facility], [address] [place], [place] [address]    Report Status PENDING  Incomplete  SARS Coronavirus 2 [facility] order, Performed in [facility] hospital lab) Nasopharyngeal Nasopharyngeal Swab     Status: None   Collection Time: 12/18/18  6:21 AM   Specimen: Nasopharyngeal Swab  Result Value Ref Range Status   SARS Coronavirus 2 NEGATIVE NEGATIVE Final    Comment: (NOTE) If result is NEGATIVE SARS-CoV-2 target nucleic acids are NOT DETECTED. The SARS-CoV-2 RNA is generally detectable in upper and lower  respiratory specimens during the acute phase of infection. The lowest  concentration of SARS-CoV-2 viral copies this assay can detect is 250  copies / mL. A negative result does not preclude SARS-CoV-2 infection  and should not be used as the sole basis for treatment or other  patient management decisions.  A negative result may occur with  improper specimen collection / handling, submission of specimen other  than nasopharyngeal swab, presence of viral mutation(s) within the  areas targeted by this assay, and inadequate number of viral copies  (<250 copies / mL). A negative result must be combined with clinical  observations, patient history, and epidemiological information. If result is POSITIVE SARS-CoV-2 target  nucleic acids are DETECTED. The SARS-CoV-2 RNA is generally detectable in upper and lower  respiratory specimens dur ing the acute phase of infection.  Positive  results are indicative of active infection with SARS-CoV-2.  Clinical  correlation with patient history and other diagnostic information is  necessary to determine patient infection status.  Positive results do  not rule out bacterial infection or co-infection with other viruses. If result is PRESUMPTIVE POSTIVE SARS-CoV-2 nucleic acids MAY BE PRESENT.   A presumptive positive result was obtained on the submitted specimen  and confirmed on repeat testing.  While 2019 novel coronavirus  (SARS-CoV-2) nucleic acids may be present in the submitted sample  additional confirmatory testing may be necessary for epidemiological  and / or clinical management purposes  to differentiate between  SARS-CoV-2 and other Sarbecovirus currently known to infect humans.  If clinically indicated additional testing with an alternate test  methodology [phone]) is advised. The SARS-CoV-2 RNA is generally  detectable in upper and lower respiratory sp ecimens during the acute  phase of infection. The expected result is Negative. Fact Sheet for Patients:  BoilerBrush.com.cy Fact Sheet for Healthcare Providers: https://pope.com/ This test is not yet approved or cleared by the [place] FDA and has been authorized for detection and/or diagnosis of SARS-CoV-2 by FDA under an Emergency Use Authorization (EUA).  This EUA will remain in effect (meaning this test can be used) for the duration of the COVID-19 declaration under Section 564(b)(1) of the Act, 21 U.S.C. section 360bbb-3(b)(1), unless the authorization is terminated or revoked sooner. Performed at [facility], [address], [place], [place] [address]   Acid Fast Smear (AFB)     Status: None   Collection Time: 12/18/18 10:29 AM   Specimen:  Sputum  Result Value Ref Range Status   AFB Specimen Processing Concentration  Final   Acid Fast Smear Negative  Final    Comment: (NOTE) Performed At: [facility] [address] [place], [place] [id number] Jolene Schimke MD KG:[id number]    Source (AFB) SPUTUM  Final    Comment: Performed at [facility], [address], [place], [place] [address]         Radiology Studies: No results found.      Scheduled  Meds: . enoxaparin (LOVENOX) injection  40 mg Subcutaneous Q24H  . feeding supplement (PRO-STAT SUGAR FREE 64)  30 mL Oral BID  . folic acid  1 mg Oral Daily  . influenza vac split quadrivalent PF  0.5 mL Intramuscular Tomorrow-1000  . LORazepam  0-4 mg Intravenous Q12H  . multivitamin with minerals  1 tablet Oral Daily  . thiamine  100 mg Oral Daily   Or  . thiamine  100 mg Intravenous Daily   Continuous Infusions: . ampicillin-sulbactam (UNASYN) IV 3 g (12/21/18 0514)  . magnesium sulfate bolus IVPB       LOS: 3 days    Time spent: 30 minutes    Lavelle Akel Hoover Brunette, DO [facility] Pager [phone]  If 7PM-7AM, please contact night-coverage [email or web address] Password [email or web address] 12/21/2018, 10:00 AM

## 2018-12-21 NOTE — Progress Notes (Signed)
Subjective: He says he feels okay.  He is still short of breath.  He is coughing a little bit.  This is nonproductive.  Objective: Vital signs in last 24 hours: Temp:  [97.7 F (36.5 C)-101.1 F (38.4 C)] 97.7 F (36.5 C) (09/27 0739) Pulse Rate:  [66-97] 82 (09/27 0739) Resp:  [14-20] 16 (09/27 0739) BP: (99-139)/(62-84) 108/74 (09/27 0739) SpO2:  [99 %-100 %] 100 % (09/27 0739) Weight:  [62.3 kg] 62.3 kg (09/27 0500) Weight change:  Last BM Date: 12/20/18  Intake/Output from previous day: 09/26 0701 - 09/27 0700 In: 2200 [P.O.:1800; IV Piggyback:400] Out: -   PHYSICAL EXAM General appearance: alert, cooperative and no distress Resp: clear to auscultation bilaterally Cardio: regular rate and rhythm, S1, S2 normal, no murmur, click, rub or gallop GI: soft, non-tender; bowel sounds normal; no masses,  no organomegaly Extremities: extremities normal, atraumatic, no cyanosis or edema  Lab Results:  Results for orders placed or performed during the hospital encounter of 12/17/18 (from the past 48 hour(s))  Comprehensive metabolic panel     Status: Abnormal   Collection Time: 12/21/18  6:56 AM  Result Value Ref Range   Sodium 133 (L) 135 - 145 mmol/L   Potassium 3.8 3.5 - 5.1 mmol/L   Chloride 103 98 - 111 mmol/L   CO2 23 22 - 32 mmol/L   Glucose, Bld 98 70 - 99 mg/dL   BUN 17 6 - 20 mg/dL   Creatinine, Ser 1.00 0.61 - 1.24 mg/dL   Calcium 8.4 (L) 8.9 - 10.3 mg/dL   Total Protein 8.1 6.5 - 8.1 g/dL   Albumin 2.4 (L) 3.5 - 5.0 g/dL   AST 62 (H) 15 - 41 U/L   ALT 33 0 - 44 U/L   Alkaline Phosphatase 94 38 - 126 U/L   Total Bilirubin 1.1 0.3 - 1.2 mg/dL   GFR calc non Af Amer >60 >60 mL/min   GFR calc Af Amer >60 >60 mL/min   Anion gap 7 5 - 15    Comment: Performed at [facility], [address] [place], [place] [address]  Magnesium     Status: Abnormal   Collection Time: 12/21/18  6:56 AM  Result Value Ref Range   Magnesium 1.4 (L) 1.7 - 2.4 mg/dL    Comment:  Performed at [facility], [address], [place], [place] [address]  CBC     Status: Abnormal   Collection Time: 12/21/18  6:56 AM  Result Value Ref Range   WBC 8.7 4.0 - 10.5 K/uL   RBC 2.71 (L) 4.22 - 5.81 MIL/uL   Hemoglobin 8.4 (L) 13.0 - 17.0 g/dL   HCT 26.2 (L) 39.0 - 52.0 %   MCV 96.7 80.0 - 100.0 fL   MCH 31.0 26.0 - 34.0 pg   MCHC 32.1 30.0 - 36.0 g/dL   RDW 13.2 11.5 - 15.5 %   Platelets 341 150 - 400 K/uL   nRBC 0.0 0.0 - 0.2 %    Comment: Performed at [facility], [address], [place], [place] [address]    ABGS No results for input(s): PHART, PO2ART, TCO2, HCO3 in the last 72 hours.  Invalid input(s): PCO2 CULTURES Recent Results (from the past 240 hour(s))  Culture, blood (routine x 2)     Status: None (Preliminary result)   Collection Time: 12/18/18  5:49 AM   Specimen: BLOOD RIGHT ARM  Result Value Ref Range Status   Specimen Description BLOOD RIGHT ARM  Final  Special Requests   Final    BOTTLES DRAWN AEROBIC AND ANAEROBIC Blood Culture adequate volume   Culture   Final    NO GROWTH 2 DAYS Performed at [facility], [address], [place], [place] [address]    Report Status PENDING  Incomplete  Culture, blood (routine x 2)     Status: None (Preliminary result)   Collection Time: 12/18/18  5:52 AM   Specimen: BLOOD RIGHT HAND  Result Value Ref Range Status   Specimen Description BLOOD RIGHT HAND  Final   Special Requests   Final    BOTTLES DRAWN AEROBIC AND ANAEROBIC Blood Culture adequate volume   Culture   Final    NO GROWTH 2 DAYS Performed at [facility], [address] [place], [place] [address]    Report Status PENDING  Incomplete  SARS Coronavirus 2 [facility] order, Performed in [facility] hospital lab) Nasopharyngeal Nasopharyngeal Swab     Status: None   Collection Time: 12/18/18  6:21 AM   Specimen: Nasopharyngeal Swab  Result Value Ref Range Status   SARS Coronavirus 2 NEGATIVE NEGATIVE Final    Comment: (NOTE) If result is  NEGATIVE SARS-CoV-2 target nucleic acids are NOT DETECTED. The SARS-CoV-2 RNA is generally detectable in upper and lower  respiratory specimens during the acute phase of infection. The lowest  concentration of SARS-CoV-2 viral copies this assay can detect is 250  copies / mL. A negative result does not preclude SARS-CoV-2 infection  and should not be used as the sole basis for treatment or other  patient management decisions.  A negative result may occur with  improper specimen collection / handling, submission of specimen other  than nasopharyngeal swab, presence of viral mutation(s) within the  areas targeted by this assay, and inadequate number of viral copies  (<250 copies / mL). A negative result must be combined with clinical  observations, patient history, and epidemiological information. If result is POSITIVE SARS-CoV-2 target nucleic acids are DETECTED. The SARS-CoV-2 RNA is generally detectable in upper and lower  respiratory specimens dur ing the acute phase of infection.  Positive  results are indicative of active infection with SARS-CoV-2.  Clinical  correlation with patient history and other diagnostic information is  necessary to determine patient infection status.  Positive results do  not rule out bacterial infection or co-infection with other viruses. If result is PRESUMPTIVE POSTIVE SARS-CoV-2 nucleic acids MAY BE PRESENT.   A presumptive positive result was obtained on the submitted specimen  and confirmed on repeat testing.  While 2019 novel coronavirus  (SARS-CoV-2) nucleic acids may be present in the submitted sample  additional confirmatory testing may be necessary for epidemiological  and / or clinical management purposes  to differentiate between  SARS-CoV-2 and other Sarbecovirus currently known to infect humans.  If clinically indicated additional testing with an alternate test  methodology [phone]) is advised. The SARS-CoV-2 RNA is generally  detectable  in upper and lower respiratory sp ecimens during the acute  phase of infection. The expected result is Negative. Fact Sheet for Patients:  BoilerBrush.com.cy Fact Sheet for Healthcare Providers: https://pope.com/ This test is not yet approved or cleared by the [place] FDA and has been authorized for detection and/or diagnosis of SARS-CoV-2 by FDA under an Emergency Use Authorization (EUA).  This EUA will remain in effect (meaning this test can be used) for the duration of the COVID-19 declaration under Section 564(b)(1) of the Act, 21 U.S.C. section 360bbb-3(b)(1), unless the authorization is terminated  or revoked sooner. Performed at [facility], [address] [place], [place] [address]   Acid Fast Smear (AFB)     Status: None   Collection Time: 12/18/18 10:29 AM   Specimen: Sputum  Result Value Ref Range Status   AFB Specimen Processing Concentration  Final   Acid Fast Smear Negative  Final    Comment: (NOTE) Performed At: [facility] [address] [place], [place] [id number] Jolene SchimkeNagendra Sanjai MD FI:[id number]:[phone]    Source (AFB) SPUTUM  Final    Comment: Performed at [facility], [address] [place], [place] [address]   Studies/Results: No results found.  Medications:  Prior to Admission:  No medications prior to admission.   Scheduled: . enoxaparin (LOVENOX) injection  40 mg Subcutaneous Q24H  . feeding supplement (PRO-STAT SUGAR FREE 64)  30 mL Oral BID  . folic acid  1 mg Oral Daily  . influenza vac split quadrivalent PF  0.5 mL Intramuscular Tomorrow-1000  . LORazepam  0-4 mg Intravenous Q12H  . multivitamin with minerals  1 tablet Oral Daily  . thiamine  100 mg Oral Daily   Or  . thiamine  100 mg Intravenous Daily   Continuous: . ampicillin-sulbactam (UNASYN) IV 3 g (12/21/18 0514)   TKZ:[id number]:acetaminophen **OR** acetaminophen  Assesment: He was admitted with a cavitary lung lesion presumably a lung  abscess but tuberculosis is not ruled out.  He has had 1- AFB smear but I am not sure that he has provided any other samples.   Principal Problem:   Cavitary lesion of lung Active Problems:   Abnormal liver function   Hyponatremia   ARF (acute renal failure) (HCC)    Plan: We will check to see if he has other AFB samples in the lab if not these need to be obtained    LOS: 3 days   Fredirick MaudlinEdward L Chanita Boden 12/21/2018, 8:52 AM

## 2018-12-22 LAB — BASIC METABOLIC PANEL
Anion gap: 9 (ref 5–15)
BUN: 17 mg/dL (ref 6–20)
CO2: 25 mmol/L (ref 22–32)
Calcium: 8.4 mg/dL — ABNORMAL LOW (ref 8.9–10.3)
Chloride: 101 mmol/L (ref 98–111)
Creatinine, Ser: 1.07 mg/dL (ref 0.61–1.24)
GFR calc Af Amer: >60 mL/min (ref >60)
GFR calc non Af Amer: >60 mL/min (ref >60)
Glucose, Bld: 114 mg/dL — ABNORMAL HIGH (ref 70–99)
Potassium: 3.9 mmol/L (ref 3.5–5.1)
Sodium: 135 mmol/L (ref 135–145)

## 2018-12-22 LAB — MAGNESIUM: Magnesium: 1.4 mg/dL — ABNORMAL LOW (ref 1.7–2.4)

## 2018-12-22 MED ORDER — SODIUM CHLORIDE 0.9 % IN NEBU
INHALATION_SOLUTION | RESPIRATORY_TRACT | Status: AC
  Filled 2018-12-22: qty 3

## 2018-12-22 MED ORDER — MAGNESIUM SULFATE 2 GM/50ML IV SOLN
2.0000 g | Freq: Once | INTRAVENOUS | Status: AC
  Administered 2018-12-22: 2 g via INTRAVENOUS
  Filled 2018-12-22: qty 50

## 2018-12-22 NOTE — Progress Notes (Signed)
PROGRESS NOTE    Jared Pope  AVW:[id number] DOB: 24-Feb-1966 DOA: 12/17/2018 PCP: Patient, [name]   Brief Narrative:  Per HPI: Patient is a59 y.o.malewith history of alcohol abuse, HTN-currently incarcerated at a local prison for almost 2 weeks-presented to the hospital for weakness and dizziness, further evaluation revealed a right upper lobe lung cavitary lesion. Subsequently admitted for further evaluation and treatment. -Was admitted remained stable, for work-up of the cavitary lesion ruling out TB versus infection. Pulmonary team consulted, following closely.  9/26:Patient is being treated for cavitary lung lesion that appears to be a lung abscess. Continuing Unasyn. He appears to be doing well. AFB smear from 9/24 has returned negative.  9/27: Patient appears to be doing well this morning and did have a slight temperature elevation overnight.  He denies any shortness of breath or chest pain.  9/28: Patient overall appears to be doing well.  His HCV RNA is positive.  Repeat AFB sputum smears are still pending.  He appears to be doing well on Unasyn overall with no symptomatic complaints or hypoxemia noted.  Replete magnesium once again.  Assessment & Plan:   Principal Problem:   Cavitary lesion of lung Active Problems:   Abnormal liver function   Hyponatremia   ARF (acute renal failure) (HCC)   Right upper lung cavitary lesion, suspect abscess -Continue current antibiotics with Unasyn -Continue to monitor AFB x3 and monitor and airborne isolation -Appreciate pulmonology input with full set of AFB sputum smears still pending  History of alcohol abuse -CIWA protocol -Has been incarcerated for 2 weeks without withdrawal  AKI-improved -Repeat labs in a.m. and monitor  Hypomagnesemia -Replete and monitor a.m. 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   DVT prophylaxis:Lovenox Code Status:Full Family Communication:None at  bedside Disposition Plan:Continue current antibiotic treatment as ordered. Appreciate pulmonology recommendations.  Continue isolation for now and obtain further AFB smears to confirm TB status.  Plan to transition to oral Augmentin by a.m.   Consultants:  Pulmonology  Procedures:  None  Antimicrobials:  Anti-infectives (From admission, onward)   Start     Dose/Rate Route Frequency Ordered Stop   12/18/18 0530  Ampicillin-Sulbactam (UNASYN) 3 g in sodium chloride 0.9 % 100 mL IVPB     3 g 200 mL/hr over 30 Minutes Intravenous Every 6 hours 12/18/18 0508         Subjective: Patient seen and evaluated today with no new acute complaints or concerns. No acute concerns or events noted overnight.  He denies any chest pain or shortness of breath.  Objective: Vitals:   12/21/18 1815 12/21/18 2314 12/22/18 0546 12/22/18 0809  BP: 119/85 (!) 137/96 98/70   Pulse: 85 83 89   Resp: Temp: 98.5 F (36.9 C) 99 F (37.2 C) 98.6 F (37 C)   TempSrc: Oral Oral Oral   SpO2: 100% 100% 100% 98%  Weight:   62.8 kg   Height:        Intake/Output Summary (Last 24 hours) at 12/22/2018 1505 Last data filed at 12/22/2018 0700 Gross per 24 hour  Intake 1360 ml  Output -  Net 1360 ml   Filed Weights   12/18/18 2300 12/21/18 0500 12/22/18 0546  Weight: 60.7 kg 62.3 kg 62.8 kg    Examination:  General exam: Appears calm and comfortable  Respiratory system: Clear to auscultation. Respiratory effort normal. Cardiovascular system: S1 & S2 heard, RRR. No JVD, murmurs, rubs, gallops or  clicks. No pedal edema. Gastrointestinal system: Abdomen is nondistended, soft and nontender. No organomegaly or masses felt. Normal bowel sounds heard. Central nervous system: Alert and oriented. No focal neurological deficits. Extremities: Symmetric 5 x 5 power. Skin: No rashes, lesions or ulcers Psychiatry: Judgement and insight appear normal. Mood & affect appropriate.     Data  Reviewed: I have personally reviewed following labs and imaging studies  CBC: Recent Labs  Lab 12/17/18 2303 12/18/18 0552 12/18/18 0755 12/19/18 0740 12/21/18 0656  WBC 13.6* 14.0*  --  9.9 8.7  NEUTROABS 9.2*  --   --   --   --   HGB 10.2* 8.4*  --  8.1* 8.4*  HCT 32.7* 26.3* 24.6* 25.0* 26.2*  MCV 98.8 97.4  --  96.5 96.7  PLT 370 350  --  328 341   Basic Metabolic Panel: Recent Labs  Lab 12/17/18 2303 12/18/18 0552 12/19/18 0740 12/21/18 0656 12/22/18 0526  NA 133* 133* 131* 133* 135  K 3.7 3.6 3.8 3.8 3.9  CL 96* 98 99 103 101  CO2 25 26 24 23 25   GLUCOSE 102* 107* 101* 98 114*  BUN 29* 30* 23* 17 17  CREATININE 1.31* 1.17 1.02 1.00 1.07  CALCIUM 8.9 8.1* 8.1* 8.4* 8.4*  MG  --  1.3* 1.6* 1.4* 1.4*  PHOS  --  3.0  --   --   --    GFR: Estimated Creatinine Clearance: 69.3 mL/min (by C-G formula based on SCr of 1.07 mg/dL). Liver Function Tests: Recent Labs  Lab 12/17/18 2303 12/18/18 0552 12/19/18 0740 12/21/18 0656  AST 84* 73* 69* 62*  ALT 48* 41 38 33  ALKPHOS 98 91 92 94  BILITOT 1.7* 1.4* 1.3* 1.1  PROT 9.6* 8.1 7.9 8.1  ALBUMIN 3.1* 2.6* 2.4* 2.4*   No results for input(s): LIPASE, AMYLASE in the last 168 hours. Recent Labs  Lab 12/17/18 2303  AMMONIA 20   Coagulation Profile: No results for input(s): INR, PROTIME in the last 168 hours. Cardiac Enzymes: No results for input(s): CKTOTAL, CKMB, CKMBINDEX, TROPONINI in the last 168 hours. BNP (last 3 results) No results for input(s): PROBNP in the last 8760 hours. HbA1C: No results for input(s): HGBA1C in the last 72 hours. CBG: No results for input(s): GLUCAP in the last 168 hours. Lipid Profile: No results for input(s): CHOL, HDL, LDLCALC, TRIG, CHOLHDL, LDLDIRECT in the last 72 hours. Thyroid Function Tests: No results for input(s): TSH, T4TOTAL, FREET4, T3FREE, THYROIDAB in the last 72 hours. Anemia Panel: No results for input(s): VITAMINB12, FOLATE, FERRITIN, TIBC, IRON, RETICCTPCT  in the last 72 hours. Sepsis Labs: No results for input(s): PROCALCITON, LATICACIDVEN in the last 168 hours.  Recent Results (from the past 240 hour(s))  Culture, blood (routine x 2)     Status: None (Preliminary result)   Collection Time: 12/18/18  5:49 AM   Specimen: BLOOD RIGHT ARM  Result Value Ref Range Status   Specimen Description BLOOD RIGHT ARM  Final   Special Requests   Final    BOTTLES DRAWN AEROBIC AND ANAEROBIC Blood Culture adequate volume   Culture   Final    NO GROWTH 4 DAYS Performed at [facility], [address] [place], [place] [address]    Report Status PENDING  Incomplete  Culture, blood (routine x 2)     Status: None (Preliminary result)   Collection Time: 12/18/18  5:52 AM   Specimen: BLOOD RIGHT HAND  Result Value Ref Range Status  Specimen Description BLOOD RIGHT HAND  Final   Special Requests   Final    BOTTLES DRAWN AEROBIC AND ANAEROBIC Blood Culture adequate volume   Culture   Final    NO GROWTH 4 DAYS Performed at [facility] [address] [place], [place] [address]    Report Status PENDING  Incomplete  SARS Coronavirus 2 [facility] order, Performed in [facility] hospital lab) Nasopharyngeal Nasopharyngeal Swab     Status: None   Collection Time: 12/18/18  6:21 AM   Specimen: Nasopharyngeal Swab  Result Value Ref Range Status   SARS Coronavirus 2 NEGATIVE NEGATIVE Final    Comment: (NOTE) If result is NEGATIVE SARS-CoV-2 target nucleic acids are NOT DETECTED. The SARS-CoV-2 RNA is generally detectable in upper and lower  respiratory specimens during the acute phase of infection. The lowest  concentration of SARS-CoV-2 viral copies this assay can detect is 250  copies / mL. A negative result does not preclude SARS-CoV-2 infection  and should not be used as the sole basis for treatment or other  patient management decisions.  A negative result may occur with  improper specimen collection / handling, submission of specimen other  than  nasopharyngeal swab, presence of viral mutation(s) within the  areas targeted by this assay, and inadequate number of viral copies  (<250 copies / mL). A negative result must be combined with clinical  observations, patient history, and epidemiological information. If result is POSITIVE SARS-CoV-2 target nucleic acids are DETECTED. The SARS-CoV-2 RNA is generally detectable in upper and lower  respiratory specimens dur ing the acute phase of infection.  Positive  results are indicative of active infection with SARS-CoV-2.  Clinical  correlation with patient history and other diagnostic information is  necessary to determine patient infection status.  Positive results do  not rule out bacterial infection or co-infection with other viruses. If result is PRESUMPTIVE POSTIVE SARS-CoV-2 nucleic acids MAY BE PRESENT.   A presumptive positive result was obtained on the submitted specimen  and confirmed on repeat testing.  While 2019 novel coronavirus  (SARS-CoV-2) nucleic acids may be present in the submitted sample  additional confirmatory testing may be necessary for epidemiological  and / or clinical management purposes  to differentiate between  SARS-CoV-2 and other Sarbecovirus currently known to infect humans.  If clinically indicated additional testing with an alternate test  methodology [phone]) is advised. The SARS-CoV-2 RNA is generally  detectable in upper and lower respiratory sp ecimens during the acute  phase of infection. The expected result is Negative. Fact Sheet for Patients:  BoilerBrush.com.cy Fact Sheet for Healthcare Providers: https://pope.com/ This test is not yet approved or cleared by the [place] FDA and has been authorized for detection and/or diagnosis of SARS-CoV-2 by FDA under an Emergency Use Authorization (EUA).  This EUA will remain in effect (meaning this test can be used) for the duration of the  COVID-19 declaration under Section 564(b)(1) of the Act, 21 U.S.C. section 360bbb-3(b)(1), unless the authorization is terminated or revoked sooner. Performed at [facility], [address], [place], [place] [address]   Acid Fast Smear (AFB)     Status: None   Collection Time: 12/18/18 10:29 AM   Specimen: Sputum  Result Value Ref Range Status   AFB Specimen Processing Concentration  Final   Acid Fast Smear Negative  Final    Comment: (NOTE) Performed At: [facility] [address] [place], [place] [id number] Jolene Schimke MD OV:[id number]    Source (AFB) SPUTUM  Final    Comment: Performed at [facility], [address], [place], [place] [address]         Radiology Studies: No results found.      Scheduled Meds: . enoxaparin (LOVENOX) injection  40 mg Subcutaneous Q24H  . feeding supplement (PRO-STAT SUGAR FREE 64)  30 mL Oral BID  . folic acid  1 mg Oral Daily  . influenza vac split quadrivalent PF  0.5 mL Intramuscular Tomorrow-1000  . multivitamin with minerals  1 tablet Oral Daily  . sodium chloride      . thiamine  100 mg Oral Daily   Or  . thiamine  100 mg Intravenous Daily   Continuous Infusions: . ampicillin-sulbactam (UNASYN) IV 3 g (12/22/18 1231)  . magnesium sulfate bolus IVPB       LOS: 4 days    Time spent: 30 minutes    Colbin Jovel Darleen Crocker, DO [facility] Pager [phone]  If 7PM-7AM, please contact night-coverage [email or web address] Password [facility] 12/22/2018, 3:05 PM

## 2018-12-22 NOTE — TOC Progression Note (Signed)
Received CSW consult for history of ETOH abuse. Reviewed pt's record and pt status was discussed in Progression. Pt is being worked up for cavitary lesion in his lung.   Pt has been in jail since 12/02/18. MD notes indicate pt "detoxed himself" prior to confinement at the jail. At this time, pt remains an inmate at the corrections facility and would not be able to utilize community resources for ETOH treatment. Pt can receive these services through the correctional facility as needed.  Will clear this consult. TOC will be available for any other dc planning needs that may arise.

## 2018-12-23 ENCOUNTER — Telehealth: Payer: Self-pay | Admitting: Gastroenterology

## 2018-12-23 ENCOUNTER — Encounter: Payer: Self-pay | Admitting: Gastroenterology

## 2018-12-23 LAB — CULTURE, BLOOD (ROUTINE X 2)
Culture: NO GROWTH
Culture: NO GROWTH
Special Requests: ADEQUATE
Special Requests: ADEQUATE

## 2018-12-23 LAB — ACID FAST SMEAR (AFB, MYCOBACTERIA): Acid Fast Smear: NEGATIVE

## 2018-12-23 LAB — CBC
HCT: 27.1 % — ABNORMAL LOW (ref 39.0–52.0)
Hemoglobin: 8.8 g/dL — ABNORMAL LOW (ref 13.0–17.0)
MCH: 31.4 pg (ref 26.0–34.0)
MCHC: 32.5 g/dL (ref 30.0–36.0)
MCV: 96.8 fL (ref 80.0–100.0)
Platelets: 370 10*3/uL (ref 150–400)
RBC: 2.8 MIL/uL — ABNORMAL LOW (ref 4.22–5.81)
RDW: 13.4 % (ref 11.5–15.5)
WBC: 8.9 10*3/uL (ref 4.0–10.5)
nRBC: 0 % (ref 0.0–0.2)

## 2018-12-23 LAB — BASIC METABOLIC PANEL
Anion gap: 11 (ref 5–15)
BUN: 14 mg/dL (ref 6–20)
CO2: 21 mmol/L — ABNORMAL LOW (ref 22–32)
Calcium: 8.4 mg/dL — ABNORMAL LOW (ref 8.9–10.3)
Chloride: 102 mmol/L (ref 98–111)
Creatinine, Ser: 0.98 mg/dL (ref 0.61–1.24)
GFR calc Af Amer: >60 mL/min (ref >60)
GFR calc non Af Amer: >60 mL/min (ref >60)
Glucose, Bld: 105 mg/dL — ABNORMAL HIGH (ref 70–99)
Potassium: 3.7 mmol/L (ref 3.5–5.1)
Sodium: 134 mmol/L — ABNORMAL LOW (ref 135–145)

## 2018-12-23 LAB — MAGNESIUM: Magnesium: 1.7 mg/dL (ref 1.7–2.4)

## 2018-12-23 MED ORDER — AMOXICILLIN-POT CLAVULANATE 875-125 MG PO TABS
1.0000 | ORAL_TABLET | Freq: Two times a day (BID) | ORAL | Status: DC
  Administered 2018-12-23 – 2018-12-25 (×5): 1 via ORAL
  Filled 2018-12-23 (×6): qty 1

## 2018-12-23 NOTE — Progress Notes (Signed)
Subjective: This is a late entry.  Documentationof my visit on 12/22/2018.  He says he feels okay.  No new complaints.  We have 1- AFB smear.  He is not coughing much now.  I requested induced sputum for AFB.  Objective: Vital signs in last 24 hours: Temp:  [98 F (36.7 C)-98.4 F (36.9 C)] 98 F (36.7 C) (09/29 0530) Pulse Rate:  [77-78] 78 (09/29 0530) Resp:  [15-16] 16 (09/29 0530) BP: (103-123)/(75-79) 123/79 (09/29 0530) SpO2:  [98 %-100 %] 100 % (09/29 0530) Weight:  [62.8 kg] 62.8 kg (09/29 0530) Weight change: 0 kg Last BM Date: 12/21/18  Intake/Output from previous day: 09/28 0701 - 09/29 0700 In: 1170 [P.O.:720; IV Piggyback:450] Out: 350 [Urine:350]  PHYSICAL EXAM General appearance: alert, cooperative and no distress Resp: rhonchi bilaterally Cardio: regular rate and rhythm, S1, S2 normal, no murmur, click, rub or gallop GI: soft, non-tender; bowel sounds normal; no masses,  no organomegaly Extremities: extremities normal, atraumatic, no cyanosis or edema  Lab Results:  Results for orders placed or performed during the hospital encounter of 12/17/18 (from the past 48 hour(s))  Basic metabolic panel     Status: Abnormal   Collection Time: 12/22/18  5:26 AM  Result Value Ref Range   Sodium 135 135 - 145 mmol/L   Potassium 3.9 3.5 - 5.1 mmol/L   Chloride 101 98 - 111 mmol/L   CO2 25 22 - 32 mmol/L   Glucose, Bld 114 (H) 70 - 99 mg/dL   BUN 17 6 - 20 mg/dL   Creatinine, Ser 9.38 0.61 - 1.24 mg/dL   Calcium 8.4 (L) 8.9 - 10.3 mg/dL   GFR calc non Af Amer >60 >60 mL/min   GFR calc Af Amer >60 >60 mL/min   Anion gap 9 5 - 15    Comment: Performed at [facility], [address] [place], [place] [address]  Magnesium     Status: Abnormal   Collection Time: 12/22/18  5:26 AM  Result Value Ref Range   Magnesium 1.4 (L) 1.7 - 2.4 mg/dL    Comment: Performed at [facility], [address], [place], [place] [address]  Magnesium     Status: None   Collection Time:  12/23/18  5:08 AM  Result Value Ref Range   Magnesium 1.7 1.7 - 2.4 mg/dL    Comment: Performed at [facility], [address] [place], [place] [address]  Basic metabolic panel     Status: Abnormal   Collection Time: 12/23/18  5:08 AM  Result Value Ref Range   Sodium 134 (L) 135 - 145 mmol/L   Potassium 3.7 3.5 - 5.1 mmol/L   Chloride 102 98 - 111 mmol/L   CO2 21 (L) 22 - 32 mmol/L   Glucose, Bld 105 (H) 70 - 99 mg/dL   BUN 14 6 - 20 mg/dL   Creatinine, Ser 2.42 0.61 - 1.24 mg/dL   Calcium 8.4 (L) 8.9 - 10.3 mg/dL   GFR calc non Af Amer >60 >60 mL/min   GFR calc Af Amer >60 >60 mL/min   Anion gap 11 5 - 15    Comment: Performed at [facility], [address], [place], [place] [address]  CBC     Status: Abnormal   Collection Time: 12/23/18  5:08 AM  Result Value Ref Range   WBC 8.9 4.0 - 10.5 K/uL   RBC 2.80 (L) 4.22 - 5.81 MIL/uL   Hemoglobin 8.8 (L) 13.0 - 17.0 g/dL  HCT 27.1 (L) 39.0 - 52.0 %   MCV 96.8 80.0 - 100.0 fL   MCH 31.4 26.0 - 34.0 pg   MCHC 32.5 30.0 - 36.0 g/dL   RDW 13.4 11.5 - 15.5 %   Platelets 370 150 - 400 K/uL   nRBC 0.0 0.0 - 0.2 %    Comment: Performed at [facility], [address] [place], [place] [address]    ABGS No results for input(s): PHART, PO2ART, TCO2, HCO3 in the last 72 hours.  Invalid input(s): PCO2 CULTURES Recent Results (from the past 240 hour(s))  Culture, blood (routine x 2)     Status: None (Preliminary result)   Collection Time: 12/18/18  5:49 AM   Specimen: BLOOD RIGHT ARM  Result Value Ref Range Status   Specimen Description BLOOD RIGHT ARM  Final   Special Requests   Final    BOTTLES DRAWN AEROBIC AND ANAEROBIC Blood Culture adequate volume   Culture   Final    NO GROWTH 4 DAYS Performed at [facility], [address] [place], [place] [address]    Report Status PENDING  Incomplete  Culture, blood (routine x 2)     Status: None (Preliminary result)   Collection Time: 12/18/18  5:52 AM   Specimen: BLOOD RIGHT HAND   Result Value Ref Range Status   Specimen Description BLOOD RIGHT HAND  Final   Special Requests   Final    BOTTLES DRAWN AEROBIC AND ANAEROBIC Blood Culture adequate volume   Culture   Final    NO GROWTH 4 DAYS Performed at [facility], [address] [place], [place] [address]    Report Status PENDING  Incomplete  SARS Coronavirus 2 [facility] order, Performed in [place] hospital lab) Nasopharyngeal Nasopharyngeal Swab     Status: None   Collection Time: 12/18/18  6:21 AM   Specimen: Nasopharyngeal Swab  Result Value Ref Range Status   SARS Coronavirus 2 NEGATIVE NEGATIVE Final    Comment: (NOTE) If result is NEGATIVE SARS-CoV-2 target nucleic acids are NOT DETECTED. The SARS-CoV-2 RNA is generally detectable in upper and lower  respiratory specimens during the acute phase of infection. The lowest  concentration of SARS-CoV-2 viral copies this assay can detect is 250  copies / mL. A negative result does not preclude SARS-CoV-2 infection  and should not be used as the sole basis for treatment or other  patient management decisions.  A negative result may occur with  improper specimen collection / handling, submission of specimen other  than nasopharyngeal swab, presence of viral mutation(s) within the  areas targeted by this assay, and inadequate number of viral copies  (<250 copies / mL). A negative result must be combined with clinical  observations, patient history, and epidemiological information. If result is POSITIVE SARS-CoV-2 target nucleic acids are DETECTED. The SARS-CoV-2 RNA is generally detectable in upper and lower  respiratory specimens dur ing the acute phase of infection.  Positive  results are indicative of active infection with SARS-CoV-2.  Clinical  correlation with patient history and other diagnostic information is  necessary to determine patient infection status.  Positive results do  not rule out bacterial infection or co-infection with other  viruses. If result is PRESUMPTIVE POSTIVE SARS-CoV-2 nucleic acids MAY BE PRESENT.   A presumptive positive result was obtained on the submitted specimen  and confirmed on repeat testing.  While 2019 novel coronavirus  (SARS-CoV-2) nucleic acids may be present in the submitted sample  additional confirmatory testing may  be necessary for epidemiological  and / or clinical management purposes  to differentiate between  SARS-CoV-2 and other Sarbecovirus currently known to infect humans.  If clinically indicated additional testing with an alternate test  methodology [phone] is advised. The SARS-CoV-2 RNA is generally  detectable in upper and lower respiratory sp ecimens during the acute  phase of infection. The expected result is Negative. Fact Sheet for Patients:  BoilerBrush.com.cyhttps://www.fda.gov/media/136312/download Fact Sheet for Healthcare Providers: https://pope.com/https://www.fda.gov/media/136313/download This test is not yet approved or cleared by the [place] FDA and has been authorized for detection and/or diagnosis of SARS-CoV-2 by FDA under an Emergency Use Authorization (EUA).  This EUA will remain in effect (meaning this test can be used) for the duration of the COVID-19 declaration under Section 564(b)(1) of the Act, 21 U.S.C. section 360bbb-3(b)(1), unless the authorization is terminated or revoked sooner. Performed at [facility], [address] [place], [place] [address]   Acid Fast Smear (AFB)     Status: None   Collection Time: 12/18/18 10:29 AM   Specimen: Sputum  Result Value Ref Range Status   AFB Specimen Processing Concentration  Final   Acid Fast Smear Negative  Final    Comment: (NOTE) Performed At: [facility] [address] [place], [place] [id number] Jolene SchimkeNagendra Sanjai MD NF:[id number]    Source (AFB) SPUTUM  Final    Comment: Performed at [facility], [address] [place], [place] [address]   Studies/Results: No results found.  Medications:  Prior to  Admission:  No medications prior to admission.   Scheduled: . enoxaparin (LOVENOX) injection  40 mg Subcutaneous Q24H  . feeding supplement (PRO-STAT SUGAR FREE 64)  30 mL Oral BID  . folic acid  1 mg Oral Daily  . influenza vac split quadrivalent PF  0.5 mL Intramuscular Tomorrow-1000  . multivitamin with minerals  1 tablet Oral Daily  . thiamine  100 mg Oral Daily   Or  . thiamine  100 mg Intravenous Daily   Continuous: . ampicillin-sulbactam (UNASYN) IV 3 g (12/23/18 0535)   XBM:[id number]:acetaminophen **OR** acetaminophen  Assesment: He has a cavitary lesion in his lung.  This is likely lung abscess but tuberculosis is a possibility.  He has 1- AFB smear and another that is in process.  Discussed with Dr. Sherryll BurgerShah and antibiotic courses generally longer with lung abscess. Principal Problem:   Cavitary lesion of lung Active Problems:   Abnormal liver function   Hyponatremia   ARF (acute renal failure) (HCC)    Plan: Continue antibiotics.  He will need 2 weeks or so.  Continue collecting AFB smears.    LOS: 5 days   Fredirick Maudlindward L Sherrel Shafer 12/23/2018, 7:05 AM

## 2018-12-23 NOTE — Telephone Encounter (Signed)
PATIENT SCHEDULED  °

## 2018-12-23 NOTE — Progress Notes (Signed)
Subjective: He says he feels okay.  Afebrile.  No new problems noted. Objective: Vital signs in last 24 hours: Temp:  [98 F (36.7 C)-98.4 F (36.9 C)] 98 F (36.7 C) (09/29 0530) Pulse Rate:  [77-78] 78 (09/29 0530) Resp:  [15-16] 16 (09/29 0530) BP: (103-123)/(75-79) 123/79 (09/29 0530) SpO2:  [98 %-100 %] 100 % (09/29 0530) Weight:  [62.8 kg] 62.8 kg (09/29 0530) Weight change: 0 kg Last BM Date: 12/21/18  Intake/Output from previous day: 09/28 0701 - 09/29 0700 In: 1170 [P.O.:720; IV Piggyback:450] Out: 350 [Urine:350]  PHYSICAL EXAM General appearance: alert, cooperative and no distress Resp: rhonchi bilaterally Cardio: regular rate and rhythm, S1, S2 normal, no murmur, click, rub or gallop GI: soft, non-tender; bowel sounds normal; no masses,  no organomegaly Extremities: extremities normal, atraumatic, no cyanosis or edema  Lab Results:  Results for orders placed or performed during the hospital encounter of 12/17/18 (from the past 48 hour(s))  Basic metabolic panel     Status: Abnormal   Collection Time: 12/22/18  5:26 AM  Result Value Ref Range   Sodium 135 135 - 145 mmol/L   Potassium 3.9 3.5 - 5.1 mmol/L   Chloride 101 98 - 111 mmol/L   CO2 25 22 - 32 mmol/L   Glucose, Bld 114 (H) 70 - 99 mg/dL   BUN 17 6 - 20 mg/dL   Creatinine, Ser 1.611.07 0.61 - 1.24 mg/dL   Calcium 8.4 (L) 8.9 - 10.3 mg/dL   GFR calc non Af Amer >60 >60 mL/min   GFR calc Af Amer >60 >60 mL/min   Anion gap 9 5 - 15    Comment: Performed at [facility], [address] [place], [place] [address]  Magnesium     Status: Abnormal   Collection Time: 12/22/18  5:26 AM  Result Value Ref Range   Magnesium 1.4 (L) 1.7 - 2.4 mg/dL    Comment: Performed at [facility], [address] [place], [place] [address]  Magnesium     Status: None   Collection Time: 12/23/18  5:08 AM  Result Value Ref Range   Magnesium 1.7 1.7 - 2.4 mg/dL    Comment: Performed at [facility], [address]  [place], [place] [address]  Basic metabolic panel     Status: Abnormal   Collection Time: 12/23/18  5:08 AM  Result Value Ref Range   Sodium 134 (L) 135 - 145 mmol/L   Potassium 3.7 3.5 - 5.1 mmol/L   Chloride 102 98 - 111 mmol/L   CO2 21 (L) 22 - 32 mmol/L   Glucose, Bld 105 (H) 70 - 99 mg/dL   BUN 14 6 - 20 mg/dL   Creatinine, Ser 7.820.98 0.61 - 1.24 mg/dL   Calcium 8.4 (L) 8.9 - 10.3 mg/dL   GFR calc non Af Amer >60 >60 mL/min   GFR calc Af Amer >60 >60 mL/min   Anion gap 11 5 - 15    Comment: Performed at [facility], [address] [place], [place] [address]  CBC     Status: Abnormal   Collection Time: 12/23/18  5:08 AM  Result Value Ref Range   WBC 8.9 4.0 - 10.5 K/uL   RBC 2.80 (L) 4.22 - 5.81 MIL/uL   Hemoglobin 8.8 (L) 13.0 - 17.0 g/dL   HCT 30.827.1 (L) 65.739.0 - 84.652.0 %   MCV 96.8 80.0 - 100.0 fL   MCH 31.4 26.0 - 34.0 pg   MCHC 32.5 30.0 - 36.0  g/dL   RDW 13.4 11.5 - 15.5 %   Platelets 370 150 - 400 K/uL   nRBC 0.0 0.0 - 0.2 %    Comment: Performed at [facility], [address] [place], [place] [address]    ABGS No results for input(s): PHART, PO2ART, TCO2, HCO3 in the last 72 hours.  Invalid input(s): PCO2 CULTURES Recent Results (from the past 240 hour(s))  Culture, blood (routine x 2)     Status: None (Preliminary result)   Collection Time: 12/18/18  5:49 AM   Specimen: BLOOD RIGHT ARM  Result Value Ref Range Status   Specimen Description BLOOD RIGHT ARM  Final   Special Requests   Final    BOTTLES DRAWN AEROBIC AND ANAEROBIC Blood Culture adequate volume   Culture   Final    NO GROWTH 4 DAYS Performed at [facility], [address], [place], [place] [address]    Report Status PENDING  Incomplete  Culture, blood (routine x 2)     Status: None (Preliminary result)   Collection Time: 12/18/18  5:52 AM   Specimen: BLOOD RIGHT HAND  Result Value Ref Range Status   Specimen Description BLOOD RIGHT HAND  Final   Special Requests   Final    BOTTLES DRAWN AEROBIC AND  ANAEROBIC Blood Culture adequate volume   Culture   Final    NO GROWTH 4 DAYS Performed at [facility], [address] [place], [place] [address]    Report Status PENDING  Incomplete  SARS Coronavirus 2 [facility] order, Performed in [place] hospital lab) Nasopharyngeal Nasopharyngeal Swab     Status: None   Collection Time: 12/18/18  6:21 AM   Specimen: Nasopharyngeal Swab  Result Value Ref Range Status   SARS Coronavirus 2 NEGATIVE NEGATIVE Final    Comment: (NOTE) If result is NEGATIVE SARS-CoV-2 target nucleic acids are NOT DETECTED. The SARS-CoV-2 RNA is generally detectable in upper and lower  respiratory specimens during the acute phase of infection. The lowest  concentration of SARS-CoV-2 viral copies this assay can detect is 250  copies / mL. A negative result does not preclude SARS-CoV-2 infection  and should not be used as the sole basis for treatment or other  patient management decisions.  A negative result may occur with  improper specimen collection / handling, submission of specimen other  than nasopharyngeal swab, presence of viral mutation(s) within the  areas targeted by this assay, and inadequate number of viral copies  (<250 copies / mL). A negative result must be combined with clinical  observations, patient history, and epidemiological information. If result is POSITIVE SARS-CoV-2 target nucleic acids are DETECTED. The SARS-CoV-2 RNA is generally detectable in upper and lower  respiratory specimens dur ing the acute phase of infection.  Positive  results are indicative of active infection with SARS-CoV-2.  Clinical  correlation with patient history and other diagnostic information is  necessary to determine patient infection status.  Positive results do  not rule out bacterial infection or co-infection with other viruses. If result is PRESUMPTIVE POSTIVE SARS-CoV-2 nucleic acids MAY BE PRESENT.   A presumptive positive result was obtained on the  submitted specimen  and confirmed on repeat testing.  While 2019 novel coronavirus  (SARS-CoV-2) nucleic acids may be present in the submitted sample  additional confirmatory testing may be necessary for epidemiological  and / or clinical management purposes  to differentiate between  SARS-CoV-2 and other Sarbecovirus currently known to infect humans.  If clinically indicated additional  testing with an alternate test  methodology [phone]) is advised. The SARS-CoV-2 RNA is generally  detectable in upper and lower respiratory sp ecimens during the acute  phase of infection. The expected result is Negative. Fact Sheet for Patients:  BoilerBrush.com.cy Fact Sheet for Healthcare Providers: https://pope.com/ This test is not yet approved or cleared by the [place] FDA and has been authorized for detection and/or diagnosis of SARS-CoV-2 by FDA under an Emergency Use Authorization (EUA).  This EUA will remain in effect (meaning this test can be used) for the duration of the COVID-19 declaration under Section 564(b)(1) of the Act, 21 U.S.C. section 360bbb-3(b)(1), unless the authorization is terminated or revoked sooner. Performed at [facility], [address], [place], [place] [address]   Acid Fast Smear (AFB)     Status: None   Collection Time: 12/18/18 10:29 AM   Specimen: Sputum  Result Value Ref Range Status   AFB Specimen Processing Concentration  Final   Acid Fast Smear Negative  Final    Comment: (NOTE) Performed At: [facility] [address] [place], [place] [address] Jolene Schimke MD TD:[id number]    Source (AFB) SPUTUM  Final    Comment: Performed at [facility], [address] [place], [place] [address]   Studies/Results: No results found.  Medications:  Prior to Admission:  No medications prior to admission.   Scheduled: . enoxaparin (LOVENOX) injection  40 mg Subcutaneous Q24H  . feeding supplement  (PRO-STAT SUGAR FREE 64)  30 mL Oral BID  . folic acid  1 mg Oral Daily  . influenza vac split quadrivalent PF  0.5 mL Intramuscular Tomorrow-1000  . multivitamin with minerals  1 tablet Oral Daily  . thiamine  100 mg Oral Daily   Or  . thiamine  100 mg Intravenous Daily   Continuous: . ampicillin-sulbactam (UNASYN) IV 3 g (12/23/18 0535)   EGB:[id number] **OR** acetaminophen  Assesment: He has a cavitary lesion of the lung that is likely related to lung abscess.  This is less likely TB but certainly not impossible.  He has 1- AFB smear and another has been obtained but has not been processed yet.  Lung abscess requires a longer course of antibiotics then typical pneumonia so he would need 2 weeks total Principal Problem:   Cavitary lesion of lung Active Problems:   Abnormal liver function   Hyponatremia   ARF (acute renal failure) (HCC)    Plan: Continue treatments    LOS: 5 days   Fredirick Maudlin 12/23/2018, 7:09 AM

## 2018-12-23 NOTE — Progress Notes (Signed)
Patient instructed that he is needed to give another smear for TB testing. Patient verbalizes understanding but states that he "cant cough up anything all day". Patient is instructed to cough up something as soon as he is able and let nursing staff know.

## 2018-12-23 NOTE — Telephone Encounter (Signed)
Jared Pope, please arrange outpatient new visit with [place] in 6-8 weeks, requested by Dr. Manuella Ghazi (hospitalist) due to Hep C. He is currently still inpatient.

## 2018-12-23 NOTE — Progress Notes (Signed)
Jared Pope  RUE:[id number] DOB: 1965-12-05 DOA: 12/17/2018 PCP: Patient, [name]   Brief Narrative:  Per HPI: Patient is a33 y.o.malewith history of alcohol abuse, HTN-currently incarcerated at a local prison for almost 2 weeks-presented to the hospital for weakness and dizziness, further evaluation revealed a right upper lobe lung cavitary lesion. Subsequently admitted for further evaluation and treatment. -Was admitted remained stable, for work-up of the cavitary lesion ruling out TB versus infection. Pulmonary team consulted, following closely.  9/26:Patient is being treated for cavitary lung lesion that appears to be a lung abscess. Continuing Unasyn. He appears to be doing well. AFB smear from 9/24 has returned negative.  9/27: Patient appears to be doing well this morning and did have a slight temperature elevation overnight. He denies any shortness of breath or chest pain.  9/28: Patient overall appears to be doing well.  His HCV RNA is positive.  Repeat AFB sputum smears are still pending.  He appears to be doing well on Unasyn overall with no symptomatic complaints or hypoxemia noted.  Replete magnesium once again.  9/29: Patient denies any symptomatic complaints or concerns and remains on isolation as full set of AFB smears are still pending to rule out TB.  He will be switched from IV Unasyn to oral Augmentin today and will require 2 full weeks of treatment.  Assessment & Plan:   Principal Problem:   Cavitary lesion of lung Active Problems:   Abnormal liver function   Hyponatremia   ARF (acute renal failure) (HCC)   Right upper lung cavitary lesion, suspect abscess -DC Unasyn and switch to Augmentin today with anticipated 2 weeks of treatment -Continue to monitor AFB x3 and monitor and airborne isolation -Appreciate pulmonology inputwith full set of AFB sputum smears still pending  History of alcohol abuse -CIWA protocol -Has  been incarcerated for 2 weeks without withdrawal  AKI-improved -Repeat labs in a.m. and monitor  Hypomagnesemia- repleted -Monitor in a.m.  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 Appreciate pulmonology recommendations.Continue isolation for now and obtain further AFB smears to confirm TB status.   Transition to oral Augmentin today.   Consultants:  Pulmonology  Procedures:  None  Antimicrobials:  Anti-infectives (From admission, onward)   Start     Dose/Rate Route Frequency Ordered Stop   12/23/18 1000  amoxicillin-clavulanate (AUGMENTIN) 875-125 MG per tablet 1 tablet     1 tablet Oral Every 12 hours 12/23/18 0759     12/18/18 0530  Ampicillin-Sulbactam (UNASYN) 3 g in sodium chloride 0.9 % 100 mL IVPB  Status:  Discontinued     3 g 200 mL/hr over 30 Minutes Intravenous Every 6 hours 12/18/18 0508 12/23/18 0759      Subjective: Patient seen and evaluated today with no new acute complaints or concerns. No acute concerns or events noted overnight.  He denies any chest pain or shortness of breath.  No fever or hypoxemia noted overnight.  Objective: Vitals:   12/22/18 0809 12/22/18 1500 12/22/18 2200 12/23/18 0530  BP:  112/75 103/75 123/79  Pulse:   77 78  Resp:  Temp:  98.2 F (36.8 C) 98.4 F (36.9 C) 98 F (36.7 C)  TempSrc:  Oral Oral Oral  SpO2: 98% 100% 100% 100%  Weight:    62.8 kg  Height:        Intake/Output Summary (Last 24 hours) at 12/23/2018 1205 Last data filed at 12/23/2018 0300 Gross per 24 hour  Intake 930 ml  Output 350 ml  Net 580 ml   Filed Weights   12/21/18 0500 12/22/18 0546 12/23/18 0530  Weight: 62.3 kg 62.8 kg 62.8 kg    Examination:  General exam:  Appears calm and comfortable  Respiratory system: Clear to auscultation. Respiratory effort normal. Cardiovascular system: S1 & S2 heard, RRR. No JVD, murmurs, rubs, gallops or clicks. No pedal edema. Gastrointestinal system: Abdomen is nondistended, soft and nontender. No organomegaly or masses felt. Normal bowel sounds heard. Central nervous system: Alert and oriented. No focal neurological deficits. Extremities: Symmetric 5 x 5 power. Skin: No rashes, lesions or ulcers Psychiatry: Judgement and insight appear normal. Mood & affect appropriate.     Data Reviewed: I have personally reviewed following labs and imaging studies  CBC: Recent Labs  Lab 12/17/18 2303 12/18/18 0552 12/18/18 0755 12/19/18 0740 12/21/18 0656 12/23/18 0508  WBC 13.6* 14.0*  --  9.9 8.7 8.9  NEUTROABS 9.2*  --   --   --   --   --   HGB 10.2* 8.4*  --  8.1* 8.4* 8.8*  HCT 32.7* 26.3* 24.6* 25.0* 26.2* 27.1*  MCV 98.8 97.4  --  96.5 96.7 96.8  PLT 370 350  --  328 341 370   Basic Metabolic Panel: Recent Labs  Lab 12/18/18 0552 12/19/18 0740 12/21/18 0656 12/22/18 0526 12/23/18 0508  NA 133* 131* 133* 135 134*  K 3.6 3.8 3.8 3.9 3.7  CL 98 99 103 101 102  CO2 26 24 23 25  21*  GLUCOSE 107* 101* 98 114* 105*  BUN 30* 23* 17 17 14   CREATININE 1.17 1.02 1.00 1.07 0.98  CALCIUM 8.1* 8.1* 8.4* 8.4* 8.4*  MG 1.3* 1.6* 1.4* 1.4* 1.7  PHOS 3.0  --   --   --   --    GFR: Estimated Creatinine Clearance: 75.7 mL/min (by C-G formula based on SCr of 0.98 mg/dL). Liver Function Tests: Recent Labs  Lab 12/17/18 2303 12/18/18 0552 12/19/18 0740 12/21/18 0656  AST 84* 73* 69* 62*  ALT 48* 41 38 33  ALKPHOS 98 91 92 94  BILITOT 1.7* 1.4* 1.3* 1.1  PROT 9.6* 8.1 7.9 8.1  ALBUMIN 3.1* 2.6* 2.4* 2.4*   No results for input(s): LIPASE, AMYLASE in the last 168 hours. Recent Labs  Lab 12/17/18 2303  AMMONIA 20   Coagulation Profile: No results for input(s): INR, PROTIME in the last 168 hours.  Cardiac Enzymes: No results for input(s): CKTOTAL, CKMB, CKMBINDEX, TROPONINI in the last 168 hours. BNP (last 3 results) No results for input(s): PROBNP in the last 8760 hours. HbA1C: No results for input(s): HGBA1C in the last 72 hours. CBG: No results for input(s): GLUCAP in the last 168 hours. Lipid Profile: No results for input(s): CHOL, HDL, LDLCALC, TRIG, CHOLHDL, LDLDIRECT in the last 72 hours. Thyroid Function Tests: No results for input(s): TSH, T4TOTAL, FREET4, T3FREE, THYROIDAB in the last 72 hours. Anemia Panel: No results for input(s): VITAMINB12, FOLATE, FERRITIN, TIBC, IRON, RETICCTPCT in the last 72 hours. Sepsis Labs: No results for input(s): PROCALCITON, LATICACIDVEN in the last 168 hours.  Recent Results (from the past 240 hour(s))  Culture, blood (routine x 2)     Status: None   Collection Time: 12/18/18  5:49 AM   Specimen:  BLOOD RIGHT ARM  Result Value Ref Range Status   Specimen Description BLOOD RIGHT ARM  Final   Special Requests   Final    BOTTLES DRAWN AEROBIC AND ANAEROBIC Blood Culture adequate volume   Culture   Final    NO GROWTH 5 DAYS Performed at [facility], [address] [place], [place] [address]    Report Status 12/23/2018 FINAL  Final  Culture, blood (routine x 2)     Status: None   Collection Time: 12/18/18  5:52 AM   Specimen: BLOOD RIGHT HAND  Result Value Ref Range Status   Specimen Description BLOOD RIGHT HAND  Final   Special Requests   Final    BOTTLES DRAWN AEROBIC AND ANAEROBIC Blood Culture adequate volume   Culture   Final    NO GROWTH 5 DAYS Performed at [facility], [address] [place], [place] [address]    Report Status 12/23/2018 FINAL  Final  SARS Coronavirus 2 [facility](Hospital order, Performed in [facility] lab) Nasopharyngeal Nasopharyngeal Swab     Status: None   Collection Time: 12/18/18  6:21 AM   Specimen: Nasopharyngeal Swab  Result Value Ref Range Status   SARS Coronavirus 2 NEGATIVE NEGATIVE Final     Comment: (NOTE) If result is NEGATIVE SARS-CoV-2 target nucleic acids are NOT DETECTED. The SARS-CoV-2 RNA is generally detectable in upper and lower  respiratory specimens during the acute phase of infection. The lowest  concentration of SARS-CoV-2 viral copies this assay can detect is 250  copies / mL. A negative result does not preclude SARS-CoV-2 infection  and should not be used as the sole basis for treatment or other  patient management decisions.  A negative result may occur with  improper specimen collection / handling, submission of specimen other  than nasopharyngeal swab, presence of viral mutation(s) within the  areas targeted by this assay, and inadequate number of viral copies  (<250 copies / mL). A negative result must be combined with clinical  observations, patient history, and epidemiological information. If result is POSITIVE SARS-CoV-2 target nucleic acids are DETECTED. The SARS-CoV-2 RNA is generally detectable in upper and lower  respiratory specimens dur ing the acute phase of infection.  Positive  results are indicative of active infection with SARS-CoV-2.  Clinical  correlation with patient history and other diagnostic information is  necessary to determine patient infection status.  Positive results do  not rule out bacterial infection or co-infection with other viruses. If result is PRESUMPTIVE POSTIVE SARS-CoV-2 nucleic acids MAY BE PRESENT.   A presumptive positive result was obtained on the submitted specimen  and confirmed on repeat testing.  While 2019 novel coronavirus  (SARS-CoV-2) nucleic acids may be present in the submitted sample  additional confirmatory testing may be necessary for epidemiological  and / or clinical management purposes  to differentiate between  SARS-CoV-2 and other Sarbecovirus currently known to infect humans.  If clinically indicated additional testing with an alternate test  methodology [phone]) is advised. The  SARS-CoV-2 RNA is generally  detectable in upper and lower respiratory sp ecimens during the acute  phase of infection. The expected result is Negative. Fact Sheet for Patients:  BoilerBrush.com.cyhttps://www.fda.gov/media/136312/download Fact Sheet for Healthcare Providers: https://pope.com/https://www.fda.gov/media/136313/download This test is not yet approved or cleared by the [place] FDA and has been authorized for detection and/or diagnosis of SARS-CoV-2 by FDA under an Emergency Use Authorization (EUA).  This EUA will remain in effect (meaning this test can be used) for the  duration of the COVID-19 declaration under Section 564(b)(1) of the Act, 21 U.S.C. section 360bbb-3(b)(1), unless the authorization is terminated or revoked sooner. Performed at [facility], [address] [place], [place] [address]   Acid Fast Smear (AFB)     Status: None   Collection Time: 12/18/18 10:29 AM   Specimen: Sputum  Result Value Ref Range Status   AFB Specimen Processing Concentration  Final   Acid Fast Smear Negative  Final    Comment: (NOTE) Performed At: [facility] [address] [place], [place] [id number] Rush Farmer MD OI:[id number]    Source (AFB) SPUTUM  Final    Comment: Performed at [facility], [address], [place], [place] [address]         Radiology Studies: No results found.      Scheduled Meds: . amoxicillin-clavulanate  1 tablet Oral Q12H  . enoxaparin (LOVENOX) injection  40 mg Subcutaneous Q24H  . feeding supplement (PRO-STAT SUGAR FREE 64)  30 mL Oral BID  . folic acid  1 mg Oral Daily  . influenza vac split quadrivalent PF  0.5 mL Intramuscular Tomorrow-1000  . multivitamin with minerals  1 tablet Oral Daily  . thiamine  100 mg Oral Daily   Or  . thiamine  100 mg Intravenous Daily   Continuous Infusions:   LOS: 5 days    Time spent: 30 minutes    Shaylie Eklund Darleen Crocker, DO [facility] Pager [phone]  If 7PM-7AM, please contact night-coverage  [email or web address] Password [facility] 12/23/2018, 12:05 PM

## 2018-12-24 LAB — BASIC METABOLIC PANEL
Anion gap: 10 (ref 5–15)
BUN: 16 mg/dL (ref 6–20)
CO2: 21 mmol/L — ABNORMAL LOW (ref 22–32)
Calcium: 8.4 mg/dL — ABNORMAL LOW (ref 8.9–10.3)
Chloride: 103 mmol/L (ref 98–111)
Creatinine, Ser: 0.99 mg/dL (ref 0.61–1.24)
GFR calc Af Amer: >60 mL/min (ref >60)
GFR calc non Af Amer: >60 mL/min (ref >60)
Glucose, Bld: 100 mg/dL — ABNORMAL HIGH (ref 70–99)
Potassium: 3.9 mmol/L (ref 3.5–5.1)
Sodium: 134 mmol/L — ABNORMAL LOW (ref 135–145)

## 2018-12-24 LAB — CBC
HCT: 27.8 % — ABNORMAL LOW (ref 39.0–52.0)
Hemoglobin: 8.9 g/dL — ABNORMAL LOW (ref 13.0–17.0)
MCH: 31.3 pg (ref 26.0–34.0)
MCHC: 32 g/dL (ref 30.0–36.0)
MCV: 97.9 fL (ref 80.0–100.0)
Platelets: 380 10*3/uL (ref 150–400)
RBC: 2.84 MIL/uL — ABNORMAL LOW (ref 4.22–5.81)
RDW: 14 % (ref 11.5–15.5)
WBC: 9.3 10*3/uL (ref 4.0–10.5)
nRBC: 0 % (ref 0.0–0.2)

## 2018-12-24 LAB — MAGNESIUM: Magnesium: 1.6 mg/dL — ABNORMAL LOW (ref 1.7–2.4)

## 2018-12-24 MED ORDER — MAGNESIUM SULFATE 2 GM/50ML IV SOLN
2.0000 g | Freq: Once | INTRAVENOUS | Status: AC
  Administered 2018-12-24: 2 g via INTRAVENOUS
  Filled 2018-12-24: qty 50

## 2018-12-24 NOTE — Progress Notes (Signed)
Subjective: He is overall about the same.  He now has 2- AFB smears and the third 1 is at the lab but has not been processed yet.  Objective: Vital signs in last 24 hours: Temp:  [98.3 F (36.8 C)] 98.3 F (36.8 C) (09/29 1212) Pulse Rate:  [79-82] 79 (09/30 0717) Resp:  [16-20] 20 (09/30 0717) BP: (102-127)/(69-81) 127/81 (09/30 0717) SpO2:  [100 %] 100 % (09/30 0717) Weight change:  Last BM Date: 12/22/18  Intake/Output from previous day: 09/29 0701 - 09/30 0700 In: 1440 [P.O.:1440] Out: -   PHYSICAL EXAM Not examined today because of lack of face shields for CAPR device  Lab Results:  Results for orders placed or performed during the hospital encounter of 12/17/18 (from the past 48 hour(s))  Magnesium     Status: None   Collection Time: 12/23/18  5:08 AM  Result Value Ref Range   Magnesium 1.7 1.7 - 2.4 mg/dL    Comment: Performed at [facility], [address] [place], [place] [address]  Basic metabolic panel     Status: Abnormal   Collection Time: 12/23/18  5:08 AM  Result Value Ref Range   Sodium 134 (L) 135 - 145 mmol/L   Potassium 3.7 3.5 - 5.1 mmol/L   Chloride 102 98 - 111 mmol/L   CO2 21 (L) 22 - 32 mmol/L   Glucose, Bld 105 (H) 70 - 99 mg/dL   BUN 14 6 - 20 mg/dL   Creatinine, Ser 6.040.98 0.61 - 1.24 mg/dL   Calcium 8.4 (L) 8.9 - 10.3 mg/dL   GFR calc non Af Amer >60 >60 mL/min   GFR calc Af Amer >60 >60 mL/min   Anion gap 11 5 - 15    Comment: Performed at [facility], [address] [place], [place] [address]  CBC     Status: Abnormal   Collection Time: 12/23/18  5:08 AM  Result Value Ref Range   WBC 8.9 4.0 - 10.5 K/uL   RBC 2.80 (L) 4.22 - 5.81 MIL/uL   Hemoglobin 8.8 (L) 13.0 - 17.0 g/dL   HCT 11.927.1 (L) 14.739.0 - 82.952.0 %   MCV 96.8 80.0 - 100.0 fL   MCH 31.4 26.0 - 34.0 pg   MCHC 32.5 30.0 - 36.0 g/dL   RDW 56.213.4 13.011.5 - 86.515.5 %   Platelets 370 150 - 400 K/uL   nRBC 0.0 0.0 - 0.2 %    Comment: Performed at [facility], [address]  [place], [place] [address]  CBC     Status: Abnormal   Collection Time: 12/24/18  5:51 AM  Result Value Ref Range   WBC 9.3 4.0 - 10.5 K/uL   RBC 2.84 (L) 4.22 - 5.81 MIL/uL   Hemoglobin 8.9 (L) 13.0 - 17.0 g/dL   HCT 62.927.8 (L) 52.839.0 - 41.352.0 %   MCV 97.9 80.0 - 100.0 fL   MCH 31.3 26.0 - 34.0 pg   MCHC 32.0 30.0 - 36.0 g/dL   RDW 24.414.0 01.011.5 - 27.215.5 %   Platelets 380 150 - 400 K/uL   nRBC 0.0 0.0 - 0.2 %    Comment: Performed at [facility], [address] [place], [place] [address]  Basic metabolic panel     Status: Abnormal   Collection Time: 12/24/18  5:51 AM  Result Value Ref Range   Sodium 134 (L) 135 - 145 mmol/L   Potassium 3.9 3.5 - 5.1 mmol/L   Chloride 103 98 - 111 mmol/L  CO2 21 (L) 22 - 32 mmol/L   Glucose, Bld 100 (H) 70 - 99 mg/dL   BUN 16 6 - 20 mg/dL   Creatinine, Ser 1.61 0.61 - 1.24 mg/dL   Calcium 8.4 (L) 8.9 - 10.3 mg/dL   GFR calc non Af Amer >60 >60 mL/min   GFR calc Af Amer >60 >60 mL/min   Anion gap 10 5 - 15    Comment: Performed at [facility], [address] [place], [place] [address]  Magnesium     Status: Abnormal   Collection Time: 12/24/18  5:51 AM  Result Value Ref Range   Magnesium 1.6 (L) 1.7 - 2.4 mg/dL    Comment: Performed at [facility], [address], [place], [place] [address]    ABGS No results for input(s): PHART, PO2ART, TCO2, HCO3 in the last 72 hours.  Invalid input(s): PCO2 CULTURES Recent Results (from the past 240 hour(s))  Culture, blood (routine x 2)     Status: None   Collection Time: 12/18/18  5:49 AM   Specimen: BLOOD RIGHT ARM  Result Value Ref Range Status   Specimen Description BLOOD RIGHT ARM  Final   Special Requests   Final    BOTTLES DRAWN AEROBIC AND ANAEROBIC Blood Culture adequate volume   Culture   Final    NO GROWTH 5 DAYS Performed at [facility], [address] [place], [place] [address]    Report Status 12/23/2018 FINAL  Final  Culture, blood (routine x 2)     Status: None   Collection Time: 12/18/18   5:52 AM   Specimen: BLOOD RIGHT HAND  Result Value Ref Range Status   Specimen Description BLOOD RIGHT HAND  Final   Special Requests   Final    BOTTLES DRAWN AEROBIC AND ANAEROBIC Blood Culture adequate volume   Culture   Final    NO GROWTH 5 DAYS Performed at [facility], [address], [place], [place] [address]    Report Status 12/23/2018 FINAL  Final  SARS Coronavirus 2 [facility] order, Performed in [facility] hospital lab) Nasopharyngeal Nasopharyngeal Swab     Status: None   Collection Time: 12/18/18  6:21 AM   Specimen: Nasopharyngeal Swab  Result Value Ref Range Status   SARS Coronavirus 2 NEGATIVE NEGATIVE Final    Comment: (NOTE) If result is NEGATIVE SARS-CoV-2 target nucleic acids are NOT DETECTED. The SARS-CoV-2 RNA is generally detectable in upper and lower  respiratory specimens during the acute phase of infection. The lowest  concentration of SARS-CoV-2 viral copies this assay can detect is 250  copies / mL. A negative result does not preclude SARS-CoV-2 infection  and should not be used as the sole basis for treatment or other  patient management decisions.  A negative result may occur with  improper specimen collection / handling, submission of specimen other  than nasopharyngeal swab, presence of viral mutation(s) within the  areas targeted by this assay, and inadequate number of viral copies  (<250 copies / mL). A negative result must be combined with clinical  observations, patient history, and epidemiological information. If result is POSITIVE SARS-CoV-2 target nucleic acids are DETECTED. The SARS-CoV-2 RNA is generally detectable in upper and lower  respiratory specimens dur ing the acute phase of infection.  Positive  results are indicative of active infection with SARS-CoV-2.  Clinical  correlation with patient history and other diagnostic information is  necessary to determine patient infection status.  Positive results do  not rule out  bacterial  infection or co-infection with other viruses. If result is PRESUMPTIVE POSTIVE SARS-CoV-2 nucleic acids MAY BE PRESENT.   A presumptive positive result was obtained on the submitted specimen  and confirmed on repeat testing.  While 2019 novel coronavirus  (SARS-CoV-2) nucleic acids may be present in the submitted sample  additional confirmatory testing may be necessary for epidemiological  and / or clinical management purposes  to differentiate between  SARS-CoV-2 and other Sarbecovirus currently known to infect humans.  If clinically indicated additional testing with an alternate test  methodology [phone]) is advised. The SARS-CoV-2 RNA is generally  detectable in upper and lower respiratory sp ecimens during the acute  phase of infection. The expected result is Negative. Fact Sheet for Patients:  StrictlyIdeas.no Fact Sheet for Healthcare Providers: BankingDealers.co.za This test is not yet approved or cleared by the [place] FDA and has been authorized for detection and/or diagnosis of SARS-CoV-2 by FDA under an Emergency Use Authorization (EUA).  This EUA will remain in effect (meaning this test can be used) for the duration of the COVID-19 declaration under Section 564(b)(1) of the Act, 21 U.S.C. section 360bbb-3(b)(1), unless the authorization is terminated or revoked sooner. Performed at [facility], [address] [place], [place] [address]   Acid Fast Smear (AFB)     Status: None   Collection Time: 12/18/18 10:29 AM   Specimen: Sputum  Result Value Ref Range Status   AFB Specimen Processing Concentration  Final   Acid Fast Smear Negative  Final    Comment: (NOTE) Performed At: [facility] [address] [place], [place] [address] Rush Farmer MD YT:[id number]    Source (AFB) SPUTUM  Final    Comment: Performed at [facility], [address] [place], [place] [address]  Acid Fast Smear (AFB)     Status:  None   Collection Time: 12/22/18  6:05 AM   Specimen: Sputum  Result Value Ref Range Status   AFB Specimen Processing Concentration  Final   Acid Fast Smear Negative  Final    Comment: (NOTE) Performed At: [facility] [place], [place] [id number] Rush Farmer MD PR:[id number]    Source (AFB) SPUTUM  Final    Comment: Performed at [facility], [address], [place],  [address]   Studies/Results: No results found.  Medications:  Prior to Admission:  No medications prior to admission.   Scheduled: . amoxicillin-clavulanate  1 tablet Oral Q12H  . enoxaparin (LOVENOX) injection  40 mg Subcutaneous Q24H  . feeding supplement (PRO-STAT SUGAR FREE 64)  30 mL Oral BID  . folic acid  1 mg Oral Daily  . influenza vac split quadrivalent PF  0.5 mL Intramuscular Tomorrow-1000  . multivitamin with minerals  1 tablet Oral Daily  . thiamine  100 mg Oral Daily   Or  . thiamine  100 mg Intravenous Daily   Continuous:  VXB:[id number] **OR** acetaminophen  Assesment: He was admitted with cavitary lesion of the lung.  I think this is likely lung abscess.  Tuberculosis is being ruled out.  He has hepatitis C and that will be further evaluated as an outpatient by GI. Principal Problem:   Cavitary lesion of lung Active Problems:   Abnormal liver function   Hyponatremia   ARF (acute renal failure) (HCC)    Plan: Continue antibiotics.  He will need at least 14 days of antibiotics total because of the lung abscess.  Await the final AFB smear and if negative can discontinue  isolation and proceed with outpatient treatment    LOS: 6 days   Jared Pope 12/24/2018, 7:51 AM

## 2018-12-24 NOTE — Progress Notes (Signed)
PROGRESS NOTE    Jared Pope  WUJ:[id number] DOB: Feb 28, 1966 DOA: 12/17/2018 PCP: Patient, [name]  Brief Narrative:  Per HPI: Patient is a21 y.o.malewith history of alcohol abuse, HTN-currently incarcerated at a local prison for almost 2 weeks-presented to the hospital for weakness and dizziness, further evaluation revealed a right upper lobe lung cavitary lesion. Subsequently admitted for further evaluation and treatment. -Was admitted remained stable, for work-up of the cavitary lesion ruling out TB versus infection. Pulmonary team consulted, following closely.  9/26:Patient is being treated for cavitary lung lesion that appears to be a lung abscess. Continuing Unasyn. He appears to be doing well. AFB smear from 9/24 has returned negative.  9/27: Patient appears to be doing well this morning and did have a slight temperature elevation overnight. He denies any shortness of breath or chest pain.  9/28: Patient overall appears to be doing well.  His HCV RNA is positive.  Repeat AFB sputum smears are still pending.  He appears to be doing well on Unasyn overall with no symptomatic complaints or hypoxemia noted.  Replete magnesium once again.  9/29: Patient denies any symptomatic complaints or concerns and remains on isolation as full set of AFB smears are still pending to rule out TB.  He will be switched from IV Unasyn to oral Augmentin today and will require 2 full weeks of treatment.  Assessment & Plan:   Principal Problem:   Cavitary lesion of lung Active Problems:   Abnormal liver function   Hyponatremia   ARF (acute renal failure) (HCC)   Right upper lung cavitary lesion, suspect abscess -Continue Augmentin for 2 weeks of treatment -AFB negative x 2, awaiting final sample.  -Appreciate pulmonology input  History of alcohol abuse -CIWA protocol -Has been incarcerated for 2 weeks without withdrawal  AKI-improved -Labs stable, AKI resolved now.    Hypomagnesemia- repleted -IV replacement ordered.   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 Appreciate pulmonology recommendations.Continue isolation for now and awaiting final AFB smears to confirm TB status.      Consultants:  Pulmonology  Procedures:  None  Antimicrobials:  Anti-infectives (From admission, onward)   Start     Dose/Rate Route Frequency Ordered Stop   12/23/18 1000  amoxicillin-clavulanate (AUGMENTIN) 875-125 MG per tablet 1 tablet     1 tablet Oral Every 12 hours 12/23/18 0759     12/18/18 0530  Ampicillin-Sulbactam (UNASYN) 3 g in sodium chloride 0.9 % 100 mL IVPB  Status:  Discontinued     3 g 200 mL/hr over 30 Minutes Intravenous Every 6 hours 12/18/18 0508 12/23/18 0759     Subjective: Patient without complaints.   Objective: Vitals:   12/23/18 0530 12/23/18 1212 12/23/18 2203 12/24/18 0717  BP: 123/79 102/69 124/78 127/81  Pulse: 78 82 82 79  Resp: Temp: 98 F (36.7 C) 98.3 F (36.8 C)    TempSrc: Oral     SpO2: 100% 100% 100% 100%  Weight: 62.8 kg     Height:        Intake/Output Summary (Last 24 hours) at 12/24/2018 0943 Last data filed at 12/24/2018 0500 Gross per 24 hour  Intake 1200 ml  Output -  Net 1200 ml   Filed Weights   12/21/18 0500 12/22/18 0546 12/23/18 0530  Weight: 62.3 kg 62.8 kg 62.8 kg   Examination:  General exam: Appears calm and comfortable  Respiratory system: Clear to auscultation. Respiratory effort normal. Cardiovascular system: S1 & S2 heard, RRR. No JVD, murmurs, rubs, gallops or clicks. No pedal edema. Gastrointestinal system: Abdomen is nondistended, soft and nontender. No organomegaly or masses felt. Normal bowel sounds  heard. Central nervous system: Alert and oriented. No focal neurological deficits. Extremities: Symmetric 5 x 5 power. Skin: No rashes, lesions or ulcers Psychiatry: Judgement and insight appear normal. Mood & affect appropriate.   Data Reviewed: I have personally reviewed following labs and imaging studies  CBC: Recent Labs  Lab 12/17/18 2303 12/18/18 0552 12/18/18 0755 12/19/18 0740 12/21/18 0656 12/23/18 0508 12/24/18 0551  WBC 13.6* 14.0*  --  9.9 8.7 8.9 9.3  NEUTROABS 9.2*  --   --   --   --   --   --   HGB 10.2* 8.4*  --  8.1* 8.4* 8.8* 8.9*  HCT 32.7* 26.3* 24.6* 25.0* 26.2* 27.1* 27.8*  MCV 98.8 97.4  --  96.5 96.7 96.8 97.9  PLT 370 350  --  328 341 370 371   Basic Metabolic Panel: Recent Labs  Lab 12/18/18 0552 12/19/18 0740 12/21/18 0656 12/22/18 0526 12/23/18 0508 12/24/18 0551  NA 133* 131* 133* 135 134* 134*  K 3.6 3.8 3.8 3.9 3.7 3.9  CL 98 99 103 101 102 103  CO2 26 24 23 25  21* 21*  GLUCOSE 107* 101* 98 114* 105* 100*  BUN 30* 23* 17 17 14 16   CREATININE 1.17 1.02 1.00 1.07 0.98 0.99  CALCIUM 8.1* 8.1* 8.4* 8.4* 8.4* 8.4*  MG 1.3* 1.6* 1.4* 1.4* 1.7 1.6*  PHOS 3.0  --   --   --   --   --    GFR: Estimated Creatinine Clearance: 74.9 mL/min (by C-G formula based on SCr of 0.99 mg/dL). Liver Function Tests: Recent Labs  Lab 12/17/18 2303 12/18/18 0552 12/19/18 0740 12/21/18 0656  AST 84* 73* 69* 62*  ALT 48* 41 38 33  ALKPHOS 98 91 92 94  BILITOT 1.7* 1.4* 1.3* 1.1  PROT 9.6* 8.1 7.9 8.1  ALBUMIN 3.1* 2.6* 2.4* 2.4*   No results for input(s): LIPASE, AMYLASE in the last 168 hours. Recent Labs  Lab 12/17/18 2303  AMMONIA 20   Coagulation Profile: No results for input(s): INR, PROTIME in the last 168 hours. Cardiac Enzymes: No results for input(s): CKTOTAL, CKMB, CKMBINDEX, TROPONINI in the last 168 hours. BNP (last 3 results) No results for input(s): PROBNP in the last 8760 hours. HbA1C: No results for input(s): HGBA1C in the  last 72 hours. CBG: No results for input(s): GLUCAP in the last 168 hours. Lipid Profile: No results for input(s): CHOL, HDL, LDLCALC, TRIG, CHOLHDL, LDLDIRECT in the last 72 hours. Thyroid Function Tests: No results for input(s): TSH, T4TOTAL, FREET4, T3FREE, THYROIDAB in the last 72 hours. Anemia Panel: No results for input(s): VITAMINB12, FOLATE, FERRITIN, TIBC, IRON, RETICCTPCT in the last 72 hours. Sepsis Labs: No results for input(s): PROCALCITON, LATICACIDVEN in the last 168 hours.  Recent Results (from the past 240 hour(s))  Culture, blood (routine x 2)     Status: None   Collection Time: 12/18/18  5:49 AM   Specimen: BLOOD RIGHT ARM  Result Value Ref Range Status   Specimen Description BLOOD RIGHT ARM  Final   Special Requests   Final    BOTTLES DRAWN AEROBIC AND ANAEROBIC Blood Culture adequate volume  Culture   Final    NO GROWTH 5 DAYS Performed at [facility], [address], [place], [place] [address]    Report Status 12/23/2018 FINAL  Final  Culture, blood (routine x 2)     Status: None   Collection Time: 12/18/18  5:52 AM   Specimen: BLOOD RIGHT HAND  Result Value Ref Range Status   Specimen Description BLOOD RIGHT HAND  Final   Special Requests   Final    BOTTLES DRAWN AEROBIC AND ANAEROBIC Blood Culture adequate volume   Culture   Final    NO GROWTH 5 DAYS Performed at [facility], [address], [place], [place] [address]    Report Status 12/23/2018 FINAL  Final  SARS Coronavirus 2 [facility] order, Performed in [facility] hospital lab) Nasopharyngeal Nasopharyngeal Swab     Status: None   Collection Time: 12/18/18  6:21 AM   Specimen: Nasopharyngeal Swab  Result Value Ref Range Status   SARS Coronavirus 2 NEGATIVE NEGATIVE Final    Comment: (NOTE) If result is NEGATIVE SARS-CoV-2 target nucleic acids are NOT DETECTED. The SARS-CoV-2 RNA is generally detectable in upper and lower  respiratory specimens during the acute phase of infection. The lowest   concentration of SARS-CoV-2 viral copies this assay can detect is 250  copies / mL. A negative result does not preclude SARS-CoV-2 infection  and should not be used as the sole basis for treatment or other  patient management decisions.  A negative result may occur with  improper specimen collection / handling, submission of specimen other  than nasopharyngeal swab, presence of viral mutation(s) within the  areas targeted by this assay, and inadequate number of viral copies  (<250 copies / mL). A negative result must be combined with clinical  observations, patient history, and epidemiological information. If result is POSITIVE SARS-CoV-2 target nucleic acids are DETECTED. The SARS-CoV-2 RNA is generally detectable in upper and lower  respiratory specimens dur ing the acute phase of infection.  Positive  results are indicative of active infection with SARS-CoV-2.  Clinical  correlation with patient history and other diagnostic information is  necessary to determine patient infection status.  Positive results do  not rule out bacterial infection or co-infection with other viruses. If result is PRESUMPTIVE POSTIVE SARS-CoV-2 nucleic acids MAY BE PRESENT.   A presumptive positive result was obtained on the submitted specimen  and confirmed on repeat testing.  While 2019 novel coronavirus  (SARS-CoV-2) nucleic acids may be present in the submitted sample  additional confirmatory testing may be necessary for epidemiological  and / or clinical management purposes  to differentiate between  SARS-CoV-2 and other Sarbecovirus currently known to infect humans.  If clinically indicated additional testing with an alternate test  methodology [phone]) is advised. The SARS-CoV-2 RNA is generally  detectable in upper and lower respiratory sp ecimens during the acute  phase of infection. The expected result is Negative. Fact Sheet for Patients:  BoilerBrush.com.cy Fact Sheet  for Healthcare Providers: https://pope.com/ This test is not yet approved or cleared by the [place] FDA and has been authorized for detection and/or diagnosis of SARS-CoV-2 by FDA under an Emergency Use Authorization (EUA).  This EUA will remain in effect (meaning this test can be used) for the duration of the COVID-19 declaration under Section 564(b)(1) of the Act, 21 U.S.C. section 360bbb-3(b)(1), unless the authorization is terminated or revoked sooner. Performed at [facility], [address], [place], [place] [address]   Acid Fast Smear (  AFB)     Status: None   Collection Time: 12/18/18 10:29 AM   Specimen: Sputum  Result Value Ref Range Status   AFB Specimen Processing Concentration  Final   Acid Fast Smear Negative  Final    Comment: (NOTE) Performed At: [facility] [address] [place], [place] [id number] Jolene SchimkeNagendra Sanjai MD WU:[id number]:[phone]    Source (AFB) SPUTUM  Final    Comment: Performed at [facility], [address] [place], [place] [address]  Acid Fast Smear (AFB)     Status: None   Collection Time: 12/22/18  6:05 AM   Specimen: Sputum  Result Value Ref Range Status   AFB Specimen Processing Concentration  Final   Acid Fast Smear Negative  Final    Comment: (NOTE) Performed At: [facility] [address] [place], [place] [id number] Jolene SchimkeNagendra Sanjai MD NG:[id number]:[phone]    Source (AFB) SPUTUM  Final    Comment: Performed at [facility], [address] [place], [place] [address]    Radiology Studies: No results found. Scheduled Meds: . amoxicillin-clavulanate  1 tablet Oral Q12H  . enoxaparin (LOVENOX) injection  40 mg Subcutaneous Q24H  . feeding supplement (PRO-STAT SUGAR FREE 64)  30 mL Oral BID  . folic acid  1 mg Oral Daily  . influenza vac split quadrivalent PF  0.5 mL Intramuscular Tomorrow-1000  . multivitamin with minerals  1 tablet Oral Daily  . thiamine  100 mg Oral Daily   Or  . thiamine  100 mg  Intravenous Daily   Continuous Infusions:   LOS: 6 days   Time spent: 30 minutes  Aaniyah Strohm Laural BenesJohnson, MD [facility] How to contact the [facility] Attending or Consulting provider 7A - 7P or covering provider during after hours 7P -7A, for this patient?  1. Check the care team in [facility] and look for a) attending/consulting [facility] provider listed and b) the [facility] team listed 2. Log into [email or web address] and use [place]'s universal password to access. If you do not have the password, please contact the hospital operator. 3. Locate the [facility] provider you are looking for under [facility] and page to a number that you can be directly reached. 4. If you still have difficulty reaching the provider, please page the [facility] (Director on Call) for the Hospitalists listed on amion for assistance.  If 7PM-7AM, please contact night-coverage [email or web address] Password [email or web address] 12/24/2018, 9:43 AM

## 2018-12-25 LAB — ACID FAST SMEAR (AFB, MYCOBACTERIA): Acid Fast Smear: NEGATIVE

## 2018-12-25 LAB — MAGNESIUM: Magnesium: 1.7 mg/dL (ref 1.7–2.4)

## 2018-12-25 LAB — CREATININE, SERUM
Creatinine, Ser: 1.14 mg/dL (ref 0.61–1.24)
GFR calc Af Amer: >60 mL/min (ref >60)
GFR calc non Af Amer: >60 mL/min (ref >60)

## 2018-12-25 MED ORDER — FOLIC ACID 1 MG PO TABS: 1.0000 mg | ORAL_TABLET | Freq: Every day | ORAL | 0 refills | Status: AC

## 2018-12-25 MED ORDER — AMOXICILLIN-POT CLAVULANATE 875-125 MG PO TABS: 1.0000 | ORAL_TABLET | Freq: Two times a day (BID) | ORAL | 0 refills | Status: AC

## 2018-12-25 MED ORDER — THIAMINE HCL 100 MG PO TABS: 100.0000 mg | ORAL_TABLET | Freq: Every day | ORAL | 0 refills | Status: AC

## 2018-12-25 MED ORDER — ADULT MULTIVITAMIN W/MINERALS CH: 1.0000 | ORAL_TABLET | Freq: Every day | ORAL | Status: DC

## 2018-12-25 MED ORDER — AMOXICILLIN-POT CLAVULANATE 875-125 MG PO TABS: 1.0000 | ORAL_TABLET | Freq: Two times a day (BID) | ORAL | 0 refills | Status: DC

## 2018-12-25 MED ORDER — FOLIC ACID 1 MG PO TABS: 1.0000 mg | ORAL_TABLET | Freq: Every day | ORAL | 0 refills | Status: DC

## 2018-12-25 MED ORDER — THIAMINE HCL 100 MG PO TABS: 100.0000 mg | ORAL_TABLET | Freq: Every day | ORAL | Status: DC

## 2018-12-25 MED ORDER — ADULT MULTIVITAMIN W/MINERALS CH: 1.0000 | ORAL_TABLET | Freq: Every day | ORAL | 0 refills | Status: AC

## 2018-12-25 NOTE — Progress Notes (Signed)
We are still waiting for the third AFB smear.  It is in the lab but not processed yet. continue antibiotics

## 2018-12-25 NOTE — Discharge Instructions (Signed)
IMPORTANT INFORMATION: PAY CLOSE ATTENTION   PHYSICIAN DISCHARGE INSTRUCTIONS  Follow with Primary care provider  Patient, [name]  and other consultants as instructed by your Hospitalist Physician  SEEK MEDICAL CARE OR RETURN TO EMERGENCY ROOM IF SYMPTOMS COME BACK, WORSEN OR NEW PROBLEM DEVELOPS   Please note: You were cared for by a hospitalist during your hospital stay. Every effort will be made to forward records to your primary care provider.  You can request that your primary care provider send for your hospital records if they have not received them.  Once you are discharged, your primary care physician will handle any further medical issues. Please note that NO REFILLS for any discharge medications will be authorized once you are discharged, as it is imperative that you return to your primary care physician (or establish a relationship with a primary care physician if you do not have one) for your post hospital discharge needs so that they can reassess your need for medications and monitor your lab values.  Please get a complete blood count and chemistry panel checked by your Primary MD at your next visit, and again as instructed by your Primary MD.  Get Medicines reviewed and adjusted: Please take all your medications with you for your next visit with your Primary MD  Laboratory/radiological data: Please request your Primary MD to go over all hospital tests and procedure/radiological results at the follow up, please ask your primary care provider to get all Hospital records sent to his/her office.  In some cases, they will be blood work, cultures and biopsy results pending at the time of your discharge. Please request that your primary care provider follow up on these results.  If you are diabetic, please bring your blood sugar readings with you to your follow up appointment with primary care.    Please call and make your follow up appointments as soon as possible.    Also Note  the following: If you experience worsening of your admission symptoms, develop shortness of breath, life threatening emergency, suicidal or homicidal thoughts you must seek medical attention immediately by calling 911 or calling your MD immediately  if symptoms less severe.  You must read complete instructions/literature along with all the possible adverse reactions/side effects for all the Medicines you take and that have been prescribed to you. Take any new Medicines after you have completely understood and accpet all the possible adverse reactions/side effects.   Do not drive when taking Pain medications or sleeping medications (Benzodiazepines)  Do not take more than prescribed Pain, Sleep and Anxiety Medications. It is not advisable to combine anxiety,sleep and pain medications without talking with your primary care practitioner  Special Instructions: If you have smoked or chewed Tobacco  in the last 2 yrs please stop smoking, stop any regular Alcohol  and or any Recreational drug use.  Wear Seat belts while driving.  Do not drive if taking any narcotic, mind altering or controlled substances or recreational drugs or alcohol.        

## 2018-12-25 NOTE — TOC Transition Note (Signed)
Transition of Care [facility]) - CM/SW Discharge Note   Patient Details  Name: HAGOP MCCOLLAM MRN: [id number] Date of Birth: March 16, 1966  [facility] CM/SW Contact:  Shade Flood, LCSW Phone Number: 12/25/2018, 4:30 PM   Clinical Narrative:     Pt discharging today. Arranged follow up with [facility] and Dr. Luan Pulling. Provided ETOH treatment resources. Provided [facility]ATCH voucher for the antibiotic. Arranged cab with voucher to take pt to [facility] to pick up anbx and then take pt home to his niece's house in [place].  There are no other TOC needs for dc.   Final next level of care: Home/Self Care Barriers to Discharge: Barriers Resolved   Patient Goals and CMS Choice        Discharge Placement                       Discharge Plan and Services In-house Referral: Clinical Social Work Discharge Planning Services: [facility], [facility], Follow-up appt scheduled Post Acute Care Choice: NA                               Social Determinants of Health (SDOH) Interventions     Readmission Risk Interventions Readmission Risk Prevention Plan 12/25/2018  Post Dischage Appt Complete  Medication Screening Complete  Transportation Screening Complete  Some recent data might be hidden

## 2018-12-25 NOTE — Discharge Summary (Signed)
Physician Discharge Summary  Jared Pope EAV:[id number] DOB: 1965/11/16 DOA: 12/17/2018  PCP: [facility] Pulmonologist: Dr. Juanetta Gosling  Admit date: 12/17/2018 Discharge date: 12/25/2018  Admitted From:  Home Disposition: Home   Recommendations for Outpatient Follow-up:  1. Follow up with PCP in 1 weeks 2. Follow up with Dr. Juanetta Gosling on 01/08/19 as scheduled.  3. Please obtain follow up chest xray in 2 weeks  Discharge Condition: STABLE   CODE STATUS: FULL    Brief Hospitalization Summary: Please see all hospital notes, images, labs for full details of the hospitalization. Brief Narrative:  Per HPI: Patient is a53 y.o.malewith history of alcohol abuse, HTN-currently incarcerated at a local prison for almost 2 weeks-presented to the hospital for weakness and dizziness, further evaluation revealed a right upper lobe lung cavitary lesion. Subsequently admitted for further evaluation and treatment. -Was admitted remained stable, for work-up of the cavitary lesion ruling out TB versus infection. Pulmonary team consulted, following closely.  9/26:Patient is being treated for cavitary lung lesion that appears to be a lung abscess. Continuing Unasyn. He appears to be doing well. AFB smear from 9/24 has returned negative.  9/27: Patient appears to be doing well this morning and did have a slight temperature elevation overnight. He denies any shortness of breath or chest pain.  9/28: Patient overall appears to be doing well. His HCV RNA is positive. Repeat AFB sputum smears are still pending. He appears to be doing well on Unasyn overall with no symptomatic complaints or hypoxemia noted. Replete magnesium once again.  9/29: Patient denies any symptomatic complaints or concerns and remains on isolation as full set of AFB smears are still pending to rule out TB.  He will be switched from IV Unasyn to oral Augmentin today and will require 2 full weeks of  treatment.  Assessment & Plan:   Principal Problem:   Cavitary lesion of lung Active Problems:   Abnormal liver function   Hyponatremia   ARF (acute renal failure) (HCC)  Right upper lung cavitary lesion, lung abscess, TB has been ruled out -Continue Augmentin for 2 weeks of treatment -AFB negative x 3 -Appreciate pulmonology input, will follow up with Dr. Juanetta Gosling on 01/08/19 for recheck.   History of alcohol abuse -CIWA protocol -Has been incarcerated for 2 weeks without withdrawal  AKI-improved -Labs stable, AKI resolved now.   Hypomagnesemia- repleted -IV replacement ordered.   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 -Discussed with GI who will follow up with him outpatient regarding this -No transaminitis currently noted  DVT prophylaxis:Lovenox Code Status:Full Family Communication:Pt updated at bedside, verbalized understanding Disposition Plan:Home, to complete full 14 day course of oral augmentin  Consultants:  Pulmonology  Procedures:  None Discharge Diagnoses:  Principal Problem:   Cavitary lesion of lung Active Problems:   Abnormal liver function   Hyponatremia   ARF (acute renal failure) (HCC)   Discharge Instructions: Discharge Instructions    Call MD for:  difficulty breathing, headache or visual disturbances   Complete by: As directed    Call MD for:  persistant dizziness or light-headedness   Complete by: As directed    Call MD for:  persistant nausea and vomiting   Complete by: As directed      Allergies as of 12/25/2018   No Known Allergies     Medication List    TAKE these medications   amoxicillin-clavulanate 875-125 MG tablet Commonly known as: AUGMENTIN Take 1 tablet by mouth every 12 (twelve) hours for 12  days.   folic acid 1 MG tablet Commonly known as: FOLVITE Take 1 tablet (1 mg total) by mouth daily. Start taking on: December 26, 2018   multivitamin with minerals Tabs tablet Take 1 tablet by mouth  daily. Start taking on: December 26, 2018   thiamine 100 MG tablet Take 1 tablet (100 mg total) by mouth daily. Start taking on: December 26, 2018      Follow-up Information    [facility]. Go on 01/01/2019.   Why: Please arrive by 1:00pm for an appointment to establish care and follow up on your hospital stay. Contact information: [address] [place] [address] [phone]        Kari Baars, MD. Go on 01/08/2019.   Specialty: Pulmonary Disease Why: Please arrive by 3:00pm for a hospital follow up on your lung status Contact information: [address] [place] [address] [phone]          No Known Allergies Allergies as of 12/25/2018   No Known Allergies     Medication List    TAKE these medications   amoxicillin-clavulanate 875-125 MG tablet Commonly known as: AUGMENTIN Take 1 tablet by mouth every 12 (twelve) hours for 12 days.   folic acid 1 MG tablet Commonly known as: FOLVITE Take 1 tablet (1 mg total) by mouth daily. Start taking on: December 26, 2018   multivitamin with minerals Tabs tablet Take 1 tablet by mouth daily. Start taking on: December 26, 2018   thiamine 100 MG tablet Take 1 tablet (100 mg total) by mouth daily. Start taking on: December 26, 2018      Procedures/Studies: Dg Chest 2 View  Result Date: 12/18/2018 CLINICAL DATA:  Altered level of consciousness. EXAM: CHEST - 2 VIEW COMPARISON:  None. FINDINGS: Right upper lobe airspace opacity with central lucencies. Left lung is clear. Heart is normal in size. Slight right hilar prominence. No pulmonary edema, pleural fluid, or pneumothorax. No acute osseous abnormalities are seen. IMPRESSION: Right upper lobe opacity with central lucencies, probable cavitary process, infection versus malignancy. Recommend further evaluation with chest CT, preferably with IV contrast. Electronically Signed   By: Narda Rutherford M.D.   On: 12/18/2018 01:59   Ct Head Wo  Contrast  Result Date: 12/17/2018 CLINICAL DATA:  Generalized muscle weakness. Altered mental status. Dizziness. EXAM: CT HEAD WITHOUT CONTRAST TECHNIQUE: Contiguous axial images were obtained from the base of the skull through the vertex without intravenous contrast. COMPARISON:  Head CT 12/17/2016 FINDINGS: Brain: No evidence of acute infarction, hemorrhage, hydrocephalus, extra-axial collection or mass lesion/mass effect. Mild generalized atrophy. Mild chronic small vessel ischemia. 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 Vascular: No hyperdense vessel or unexpected calcification. Skull: No fracture or focal lesion. Sinuses/Orbits: Paranasal sinuses and mastoid air cells are clear. The visualized orbits are unremarkable. Other: Chronic right parietal scalp scarring. IMPRESSION: 1. No acute intracranial abnormality. 2. Mild generalized atrophy and chronic small vessel ischemia, slightly advanced for age. Electronically Signed   By: Narda Rutherford M.D.   On: 12/17/2018 23:12   Ct Chest W Contrast  Result Date: 12/18/2018 CLINICAL DATA:  53 year old male with dizziness. EXAM: CT CHEST WITH CONTRAST TECHNIQUE: Multidetector CT imaging of the chest was performed during intravenous contrast administration. CONTRAST:  75mL OMNIPAQUE IOHEXOL 300 MG/ML  SOLN COMPARISON:  Chest radiograph dated 12/18/2018 FINDINGS: Cardiovascular: There is no cardiomegaly or pericardial effusion. The thoracic aorta is unremarkable. The origins of the great vessels of  the aortic arch appear patent. The central pulmonary arteries are grossly unremarkable for the degree of opacification. Mediastinum/Nodes: No hilar or mediastinal adenopathy. The esophagus and the thyroid gland are grossly unremarkable. No mediastinal fluid collection. Lungs/Pleura: There is a 3.3 x 3.0 cm cavitary lesion in the right upper lobe with ground-glass and nodular density in the adjacent lung parenchyma. Findings may  represent fungal infection, abscess, TB. Malignancy is not excluded. Clinical correlation and follow-up to resolution after treatment is recommended. The left lung is clear. Upper Abdomen: Fatty infiltration of the liver. The visualized upper abdomen is otherwise unremarkable. Musculoskeletal: No chest wall abnormality. No acute or significant osseous findings. IMPRESSION: Cavitary lesion in the right upper lobe with ground-glass and nodular density in the adjacent lung parenchyma. Findings may represent fungal infection, abscess, TB. Malignancy is not excluded. Clinical correlation and follow-up to resolution after treatment is recommended. Electronically Signed   By: Elgie Collard M.D.   On: 12/18/2018 03:21   US Abdomen Limited Ruq  Result Date: 12/18/2018 CLINICAL DATA:  Abnormal LFTs EXAM: ULTRASOUND ABDOMEN LIMITED RIGHT UPPER QUADRANT COMPARISON:  None Correlation CT chest 12/18/2018 FINDINGS: Gallbladder: Incompletely distended. Gallbladder wall appears thickened. No shadowing calculi or pericholecystic fluid. No sonographic Murphy sign. Common bile duct: Diameter: 6 mm, upper normal Liver: Echogenic parenchyma, likely fatty infiltration though this can be seen with cirrhosis and certain infiltrative disorders. 7 x 6 x 11 mm hyperechoic focus within RIGHT lobe of liver without shadowing nonspecific but question tiny hemangioma. This is questionably visualized as a hypoechoic nodule on the CT chest exam series 2, image 120. Portal vein is patent on color Doppler imaging with normal direction of blood flow towards the liver. Other: No RIGHT upper quadrant free fluid. IMPRESSION: Nonspecific mild wall thickening of the gallbladder without evidence of gallstones or pericholecystic fluid. Probable mild fatty infiltration of liver. Question 11 mm hemangioma RIGHT lobe liver, questionably visualized as slightly low-attenuation nodule on CT. Electronically Signed   By: Ulyses Southward M.D.   On: 12/18/2018 10:47       Subjective: Pt without complaints, no SOB, no chills or rigors, no nausea or emesis, no fever or nightsweats.  Feels well.   Discharge Exam: Vitals:   12/24/18 2036 12/25/18 0606  BP: 110/79 103/72  Pulse: 81 80  Resp: 16 17  Temp: 98.6 F (37 C)   SpO2: 100% 100%   Vitals:   12/24/18 0717 12/24/18 1514 12/24/18 2036 12/25/18 0606  BP: 127/81 111/77 110/79 103/72  Pulse: 79 81 81 80  Resp: Temp:  (!) 97.5 F (36.4 C) 98.6 F (37 C)   TempSrc:  Oral Oral   SpO2: 100% 100% 100% 100%  Weight:      Height:       General exam: Appears calm and comfortable  Respiratory system: Clear to auscultation. Respiratory effort normal. Cardiovascular system: S1 & S2 heard, RRR. No JVD, murmurs, rubs, gallops or clicks. No pedal edema. Gastrointestinal system: Abdomen is nondistended, soft and nontender. No organomegaly or masses felt. Normal bowel sounds heard. Central nervous system: Alert and oriented. No focal neurological deficits. Extremities: Symmetric 5 x 5 power. Skin: No rashes, lesions or ulcers Psychiatry: Judgement and insight appear normal. Mood & affect appropriate.    The results of significant diagnostics from this hospitalization (including imaging, microbiology, ancillary and laboratory) are listed below for reference.     Microbiology: Recent Results (from the past 240 hour(s))  Culture, blood (routine x  2)     Status: None   Collection Time: 12/18/18  5:49 AM   Specimen: BLOOD RIGHT ARM  Result Value Ref Range Status   Specimen Description BLOOD RIGHT ARM  Final   Special Requests   Final    BOTTLES DRAWN AEROBIC AND ANAEROBIC Blood Culture adequate volume   Culture   Final    NO GROWTH 5 DAYS Performed at [facility], [address] [place], [place] [address]    Report Status 12/23/2018 FINAL  Final  Culture, blood (routine x 2)     Status: None   Collection Time: 12/18/18  5:52 AM   Specimen: BLOOD RIGHT HAND  Result Value Ref Range  Status   Specimen Description BLOOD RIGHT HAND  Final   Special Requests   Final    BOTTLES DRAWN AEROBIC AND ANAEROBIC Blood Culture adequate volume   Culture   Final    NO GROWTH 5 DAYS Performed at [facility], [address] [place], [place] [address]    Report Status 12/23/2018 FINAL  Final  SARS Coronavirus 2 [facility](Hospital order, Performed [facility]) Nasopharyngeal Nasopharyngeal Swab     Status: None   Collection Time: 12/18/18  6:21 AM   Specimen: Nasopharyngeal Swab  Result Value Ref Range Status   SARS Coronavirus 2 NEGATIVE NEGATIVE Final    Comment: (NOTE) If result is NEGATIVE SARS-CoV-2 target nucleic acids are NOT DETECTED. The SARS-CoV-2 RNA is generally detectable in upper and lower  respiratory specimens during the acute phase of infection. The lowest  concentration of SARS-CoV-2 viral copies this assay can detect is 250  copies / mL. A negative result does not preclude SARS-CoV-2 infection  and should not be used as the sole basis for treatment or other  patient management decisions.  A negative result may occur with  improper specimen collection / handling, submission of specimen other  than nasopharyngeal swab, presence of viral mutation(s) within the  areas targeted by this assay, and inadequate number of viral copies  (<250 copies / mL). A negative result must be combined with clinical  observations, patient history, and epidemiological information. If result is POSITIVE SARS-CoV-2 target nucleic acids are DETECTED. The SARS-CoV-2 RNA is generally detectable in upper and lower  respiratory specimens dur ing the acute phase of infection.  Positive  results are indicative of active infection with SARS-CoV-2.  Clinical  correlation with patient history and other diagnostic information is  necessary to determine patient infection status.  Positive results do  not rule out bacterial infection or co-infection with other viruses. If result is  PRESUMPTIVE POSTIVE SARS-CoV-2 nucleic acids MAY BE PRESENT.   A presumptive positive result was obtained on the submitted specimen  and confirmed on repeat testing.  While 2019 novel coronavirus  (SARS-CoV-2) nucleic acids may be present in the submitted sample  additional confirmatory testing may be necessary for epidemiological  and / or clinical management purposes  to differentiate between  SARS-CoV-2 and other Sarbecovirus currently known to infect humans.  If clinically indicated additional testing with an alternate test  methodology [phone]([id number]) is advised. The SARS-CoV-2 RNA is generally  detectable in upper and lower respiratory sp ecimens during the acute  phase of infection. The expected result is Negative. Fact Sheet for Patients:  BoilerBrush.com.cyhttps://www.fda.gov/media/136312/download Fact Sheet for Healthcare Providers: https://pope.com/https://www.fda.gov/media/136313/download This test is not yet approved or cleared by the [place] FDA and has been authorized for detection and/or diagnosis of SARS-CoV-2 by FDA under an Emergency  Use Authorization (EUA).  This EUA will remain in effect (meaning this test can be used) for the duration of the COVID-19 declaration under Section 564(b)(1) of the Act, 21 U.S.C. section 360bbb-3(b)(1), unless the authorization is terminated or revoked sooner. Performed at [facility], [address], [place], [place] [address]   Acid Fast Smear (AFB)     Status: None   Collection Time: 12/18/18 10:29 AM   Specimen: Sputum  Result Value Ref Range Status   AFB Specimen Processing Concentration  Final   Acid Fast Smear Negative  Final    Comment: (NOTE) Performed At: [facility] [address] [place], [place] [id number] Rush Farmer MD [id number]    Source (AFB) SPUTUM  Final    Comment: Performed at [facility], [address], [place], [place] [address]  Acid Fast Smear (AFB)     Status: None   Collection Time: 12/22/18  6:05 AM   Specimen:  Sputum  Result Value Ref Range Status   AFB Specimen Processing Concentration  Final   Acid Fast Smear Negative  Final    Comment: (NOTE) Performed At: [facility] [address] [place], [place] [id number] Rush Farmer MD [id number]    Source (AFB) SPUTUM  Final    Comment: Performed at [facility], [address] [place], [place] [address]  Acid Fast Smear (AFB)     Status: None   Collection Time: 12/23/18 10:11 PM   Specimen: Sputum  Result Value Ref Range Status   AFB Specimen Processing Concentration  Final   Acid Fast Smear Negative  Final    Comment: (NOTE) Performed At: [facility] [place], [place] [id number] Rush Farmer MD [id number]    Source (AFB) SPU  Final    Comment: Performed at [facility], [address], [place], [place] [address]     Labs: BNP (last 3 results) No results for input(s): BNP in the last 8760 hours. Basic Metabolic Panel: Recent Labs  Lab 12/19/18 0740 12/21/18 8416 12/22/18 0526 12/23/18 0508 12/24/18 0551 12/25/18 0529 12/25/18 0800  NA 131* 133* 135 134* 134*  --   --   K 3.8 3.8 3.9 3.7 3.9  --   --   CL 99 103 101 102 103  --   --   CO2 24 23 25  21* 21*  --   --   GLUCOSE 101* 98 114* 105* 100*  --   --   BUN 23* 17 17 14 16   --   --   CREATININE 1.02 1.00 1.07 0.98 0.99 1.14  --   CALCIUM 8.1* 8.4* 8.4* 8.4* 8.4*  --   --   MG 1.6* 1.4* 1.4* 1.7 1.6*  --  1.7   Liver Function Tests: Recent Labs  Lab 12/19/18 0740 12/21/18 0656  AST 69* 62*  ALT 38 33  ALKPHOS 92 94  BILITOT 1.3* 1.1  PROT 7.9 8.1  ALBUMIN 2.4* 2.4*   No results for input(s): LIPASE, AMYLASE in the last 168 hours. No results for input(s): AMMONIA in the last 168 hours. CBC: Recent Labs  Lab 12/19/18 0740 12/21/18 0656 12/23/18 0508 12/24/18 0551  WBC 9.9 8.7 8.9 9.3  HGB 8.1* 8.4* 8.8* 8.9*  HCT 25.0* 26.2* 27.1* 27.8*  MCV 96.5 96.7 96.8 97.9  PLT 328 341 370 380   Cardiac Enzymes: No results  for input(s): CKTOTAL, CKMB, CKMBINDEX, TROPONINI in the last 168 hours. BNP: Invalid input(s): POCBNP CBG: No results for input(s): GLUCAP  in the last 168 hours. D-Dimer No results for input(s): DDIMER in the last 72 hours. Hgb A1c No results for input(s): HGBA1C in the last 72 hours. Lipid Profile No results for input(s): CHOL, HDL, LDLCALC, TRIG, CHOLHDL, LDLDIRECT in the last 72 hours. Thyroid function studies No results for input(s): TSH, T4TOTAL, T3FREE, THYROIDAB in the last 72 hours.  Invalid input(s): FREET3 Anemia work up No results for input(s): VITAMINB12, FOLATE, FERRITIN, TIBC, IRON, RETICCTPCT in the last 72 hours. Urinalysis    Component Value Date/Time   COLORURINE AMBER (A) 12/18/2018 0359   APPEARANCEUR CLEAR 12/18/2018 0359   LABSPEC 1.033 (H) 12/18/2018 0359   PHURINE 6.0 12/18/2018 0359   GLUCOSEU NEGATIVE 12/18/2018 0359   HGBUR NEGATIVE 12/18/2018 0359   BILIRUBINUR NEGATIVE 12/18/2018 0359   KETONESUR NEGATIVE 12/18/2018 0359   PROTEINUR NEGATIVE 12/18/2018 0359   NITRITE NEGATIVE 12/18/2018 0359   LEUKOCYTESUR NEGATIVE 12/18/2018 0359   Sepsis Labs Invalid input(s): PROCALCITONIN,  WBC,  LACTICIDVEN Microbiology Recent Results (from the past 240 hour(s))  Culture, blood (routine x 2)     Status: None   Collection Time: 12/18/18  5:49 AM   Specimen: BLOOD RIGHT ARM  Result Value Ref Range Status   Specimen Description BLOOD RIGHT ARM  Final   Special Requests   Final    BOTTLES DRAWN AEROBIC AND ANAEROBIC Blood Culture adequate volume   Culture   Final    NO GROWTH 5 DAYS Performed at [facility], [address] [place], [place] [address]    Report Status 12/23/2018 FINAL  Final  Culture, blood (routine x 2)     Status: None   Collection Time: 12/18/18  5:52 AM   Specimen: BLOOD RIGHT HAND  Result Value Ref Range Status   Specimen Description BLOOD RIGHT HAND  Final   Special Requests   Final    BOTTLES DRAWN AEROBIC AND ANAEROBIC Blood  Culture adequate volume   Culture   Final    NO GROWTH 5 DAYS Performed at [facility], [address], [place], [place] [address]    Report Status 12/23/2018 FINAL  Final  SARS Coronavirus 2 [facility] order, Performed in [facility] hospital lab) Nasopharyngeal Nasopharyngeal Swab     Status: None   Collection Time: 12/18/18  6:21 AM   Specimen: Nasopharyngeal Swab  Result Value Ref Range Status   SARS Coronavirus 2 NEGATIVE NEGATIVE Final    Comment: (NOTE) If result is NEGATIVE SARS-CoV-2 target nucleic acids are NOT DETECTED. The SARS-CoV-2 RNA is generally detectable in upper and lower  respiratory specimens during the acute phase of infection. The lowest  concentration of SARS-CoV-2 viral copies this assay can detect is 250  copies / mL. A negative result does not preclude SARS-CoV-2 infection  and should not be used as the sole basis for treatment or other  patient management decisions.  A negative result may occur with  improper specimen collection / handling, submission of specimen other  than nasopharyngeal swab, presence of viral mutation(s) within the  areas targeted by this assay, and inadequate number of viral copies  (<250 copies / mL). A negative result must be combined with clinical  observations, patient history, and epidemiological information. If result is POSITIVE SARS-CoV-2 target nucleic acids are DETECTED. The SARS-CoV-2 RNA is generally detectable in upper and lower  respiratory specimens dur ing the acute phase of infection.  Positive  results are indicative of active infection with SARS-CoV-2.  Clinical  correlation with patient history and other  diagnostic information is  necessary to determine patient infection status.  Positive results do  not rule out bacterial infection or co-infection with other viruses. If result is PRESUMPTIVE POSTIVE SARS-CoV-2 nucleic acids MAY BE PRESENT.   A presumptive positive result was obtained on the submitted specimen   and confirmed on repeat testing.  While 2019 novel coronavirus  (SARS-CoV-2) nucleic acids may be present in the submitted sample  additional confirmatory testing may be necessary for epidemiological  and / or clinical management purposes  to differentiate between  SARS-CoV-2 and other Sarbecovirus currently known to infect humans.  If clinically indicated additional testing with an alternate test  methodology [phone]) is advised. The SARS-CoV-2 RNA is generally  detectable in upper and lower respiratory sp ecimens during the acute  phase of infection. The expected result is Negative. Fact Sheet for Patients:  BoilerBrush.com.cy Fact Sheet for Healthcare Providers: https://pope.com/ This test is not yet approved or cleared by the [place] FDA and has been authorized for detection and/or diagnosis of SARS-CoV-2 by FDA under an Emergency Use Authorization (EUA).  This EUA will remain in effect (meaning this test can be used) for the duration of the COVID-19 declaration under Section 564(b)(1) of the Act, 21 U.S.C. section 360bbb-3(b)(1), unless the authorization is terminated or revoked sooner. Performed at [facility], [address], [place], [place] [address]   Acid Fast Smear (AFB)     Status: None   Collection Time: 12/18/18 10:29 AM   Specimen: Sputum  Result Value Ref Range Status   AFB Specimen Processing Concentration  Final   Acid Fast Smear Negative  Final    Comment: (NOTE) Performed At: [facility] [address] [place], [place] [id number] Jolene Schimke MD [id number]    Source (AFB) SPUTUM  Final    Comment: Performed at [facility], [address], [place], [place] [address]  Acid Fast Smear (AFB)     Status: None   Collection Time: 12/22/18  6:05 AM   Specimen: Sputum  Result Value Ref Range Status   AFB Specimen Processing Concentration  Final   Acid Fast Smear Negative  Final    Comment:  (NOTE) Performed At: [facility] [address] [place], [place] [id number] Jolene Schimke MD [id number]    Source (AFB) SPUTUM  Final    Comment: Performed at [facility], [address], [place], [place] [address]  Acid Fast Smear (AFB)     Status: None   Collection Time: 12/23/18 10:11 PM   Specimen: Sputum  Result Value Ref Range Status   AFB Specimen Processing Concentration  Final   Acid Fast Smear Negative  Final    Comment: (NOTE) Performed At: [facility] [facility] [address] [place], [place] [id number] Jolene Schimke MD FI:[id number]    Source (AFB) SPU  Final    Comment: Performed at [facility], [address], [place], [place] [address]    Time coordinating discharge:   SIGNED:  Standley Dakins, MD  [facility] 12/25/2018, 2:47 PM How to contact the [facility] Attending or Consulting provider 7A - 7P or covering provider during after hours 7P -7A, for this patient?  1. Check the care team in [facility] and look for a) attending/consulting [facility] provider listed and b) the [facility] team listed 2. Log into [email or web address] and use [place]'s universal password to access. If you do not have the password, please contact the hospital operator. 3. Locate the [facility] provider you are looking for under [facility] and page  to a number that you can be directly reached. 4. If you still have difficulty reaching the provider, please page the [facility] (Director on Call) for the Hospitalists listed on amion for assistance.

## 2019-02-02 LAB — ACID FAST CULTURE WITH REFLEXED SENSITIVITIES (MYCOBACTERIA): Acid Fast Culture: NEGATIVE

## 2019-02-03 LAB — ACID FAST CULTURE WITH REFLEXED SENSITIVITIES (MYCOBACTERIA): Acid Fast Culture: NEGATIVE

## 2019-02-06 LAB — ACID FAST CULTURE WITH REFLEXED SENSITIVITIES (MYCOBACTERIA): Acid Fast Culture: NEGATIVE

## 2019-02-23 NOTE — Progress Notes (Deleted)
Positive Hep C antibody with positive qualitative RNA while inpatient. Here to establish GI care. Needs quantitative

## 2019-02-24 ENCOUNTER — Telehealth: Payer: Self-pay | Admitting: Gastroenterology

## 2019-02-24 ENCOUNTER — Encounter: Payer: Self-pay | Admitting: Gastroenterology

## 2019-02-24 ENCOUNTER — Ambulatory Visit: Payer: Self-pay | Admitting: Gastroenterology

## 2019-02-24 NOTE — Telephone Encounter (Signed)
PATIENT WAS A NO SHOW AND LETTER SENT  °

## 2020-07-08 IMAGING — CT CT HEAD W/O CM
4 series · 16 of 47 positions shown, 18 images · non-contrast
Comparison: Head CT 12/17/2016

CLINICAL DATA: Generalized muscle weakness. Altered mental status.
Dizziness.

EXAM:
CT HEAD WITHOUT CONTRAST
TECHNIQUE: Contiguous axial images were obtained from the base of the skull
through the vertex without intravenous contrast.

[Series 2: head w o · axial · 0.46mm/px · z∈[+1678,+1793]mm · 7 of 31 slices shown, 9 images]
[im 4/31  brain]
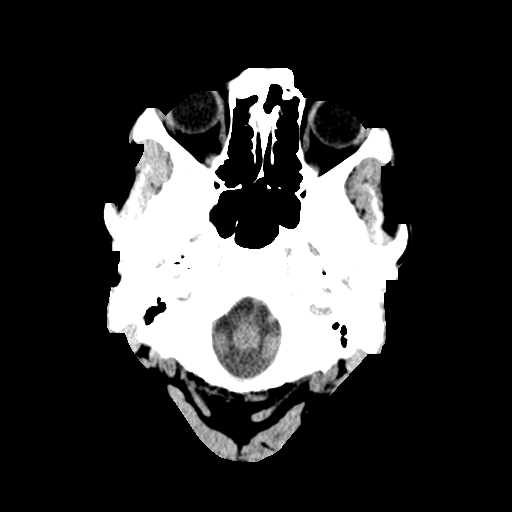
[im 4/31  bone]
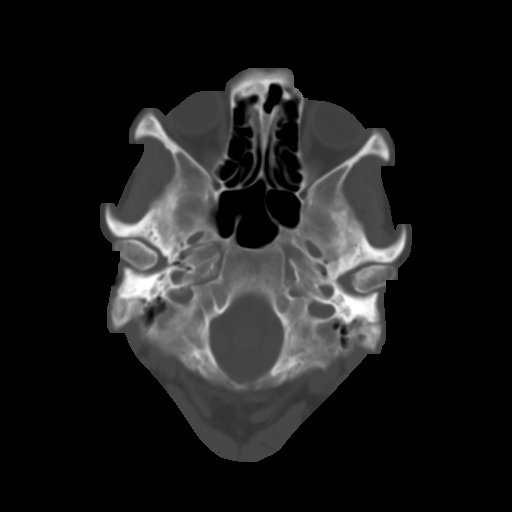
[im 8/31  brain]
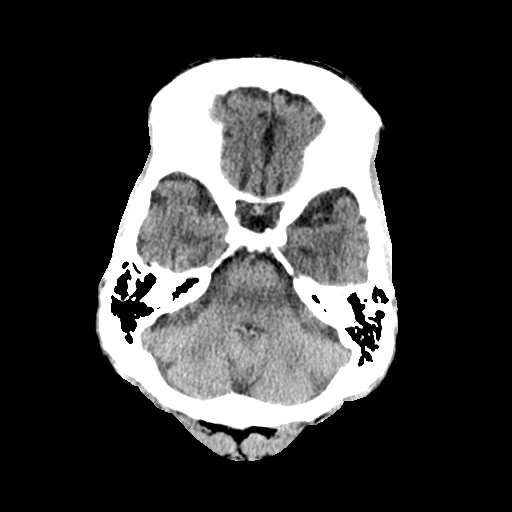
[im 12/31  brain]
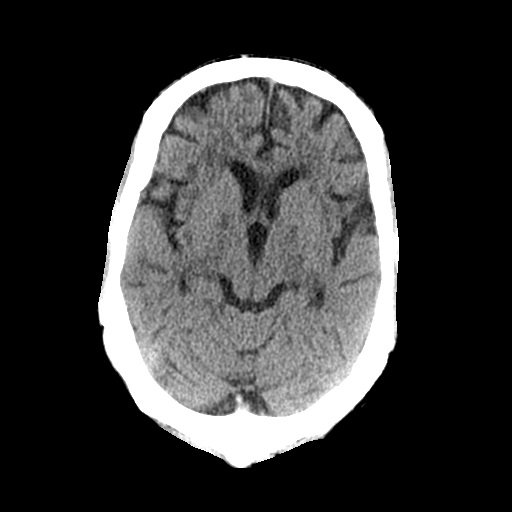
[im 16/31  brain]
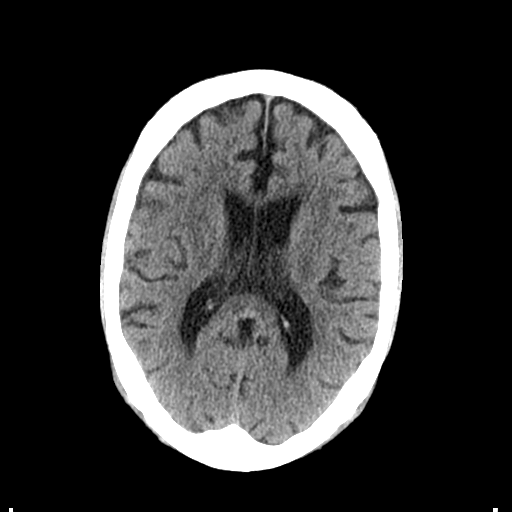
[im 19/31  brain]
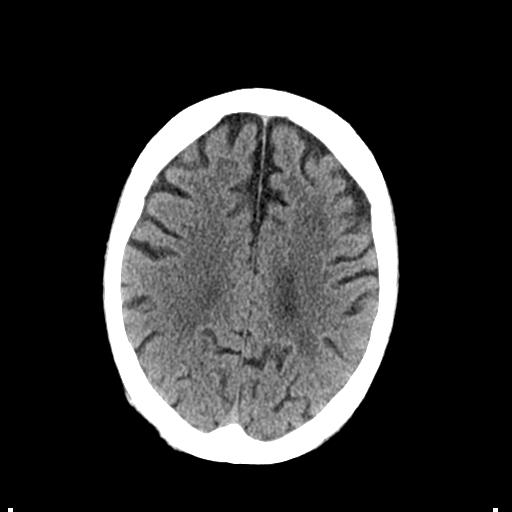
[im 19/31  bone]
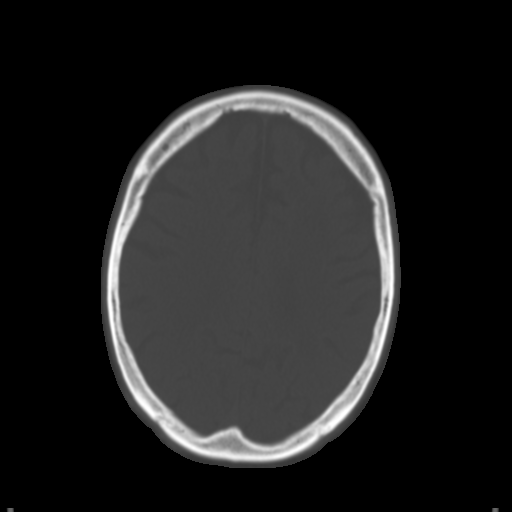
[im 23/31  brain]
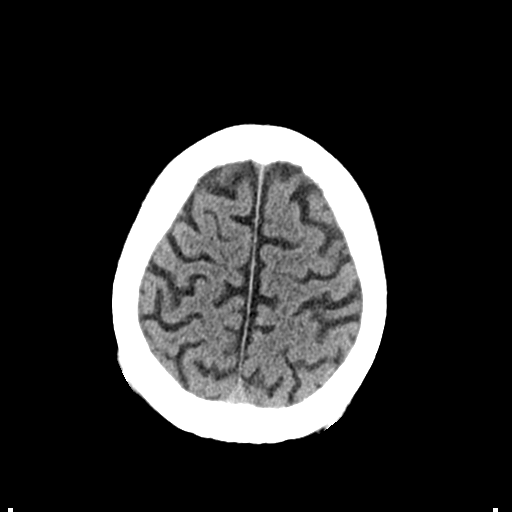
[im 27/31  brain]
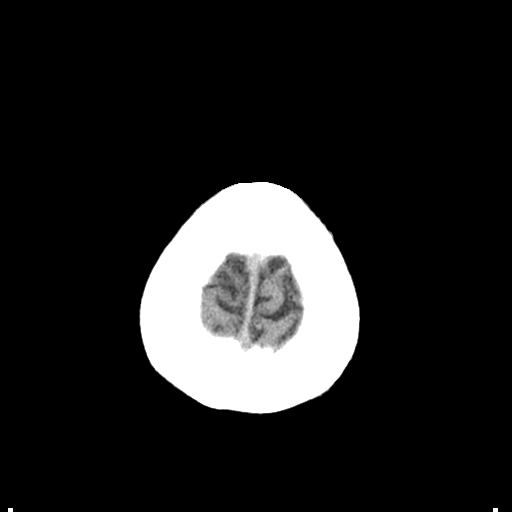

[Series 3: head bone · axial · 0.46mm/px · z∈[+1677,+1709]mm · 3 of 78 slices shown]
[im 8/78  bone]
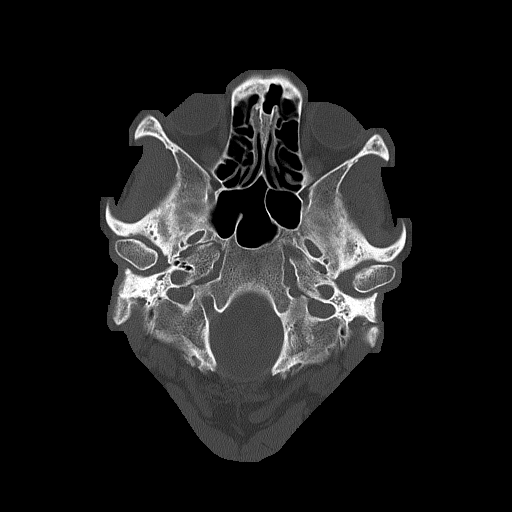
[im 16/78  bone]
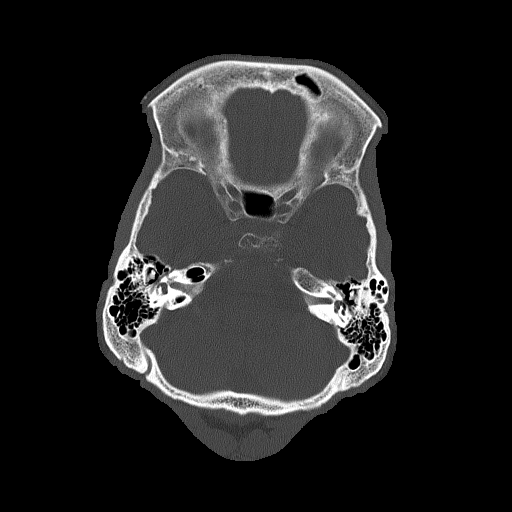
[im 24/78  bone]
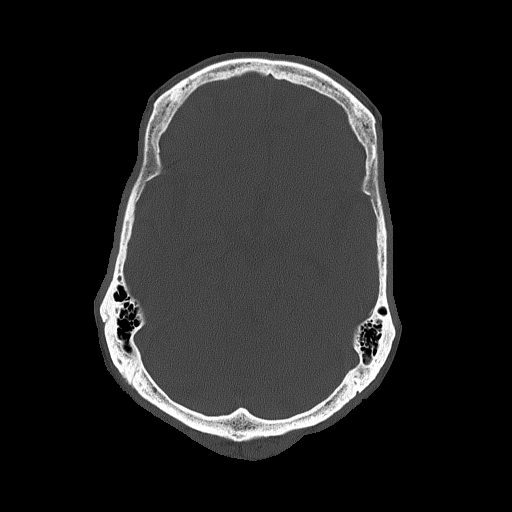

[Series 4: coronal soft · coronal · 0.31mm/px · 3 of 67 slices shown]
[im 23/67  brain]
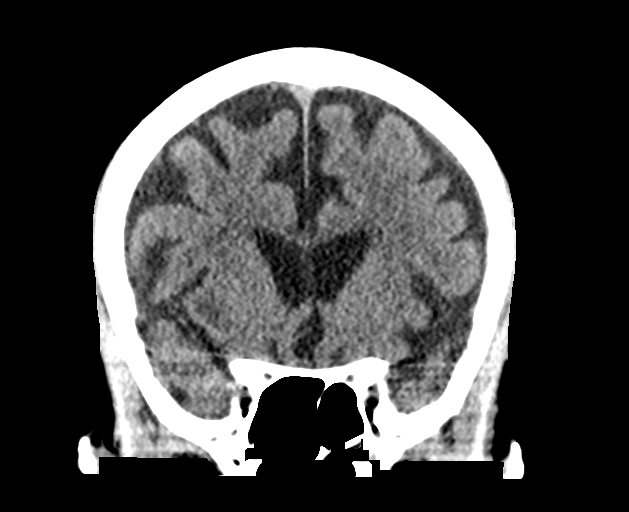
[im 30/67  brain]
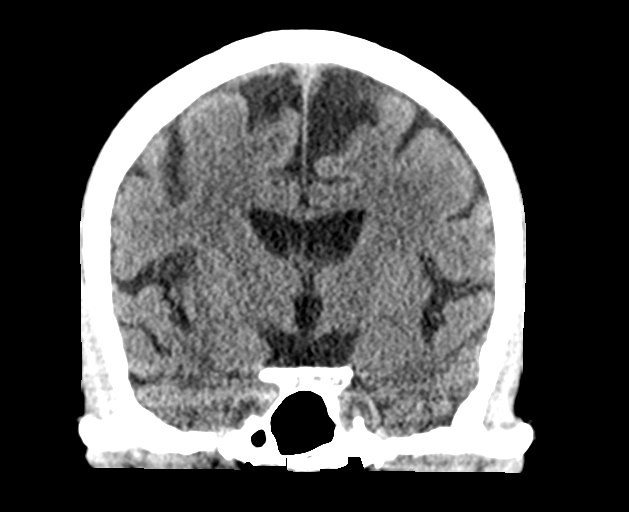
[im 37/67  brain]
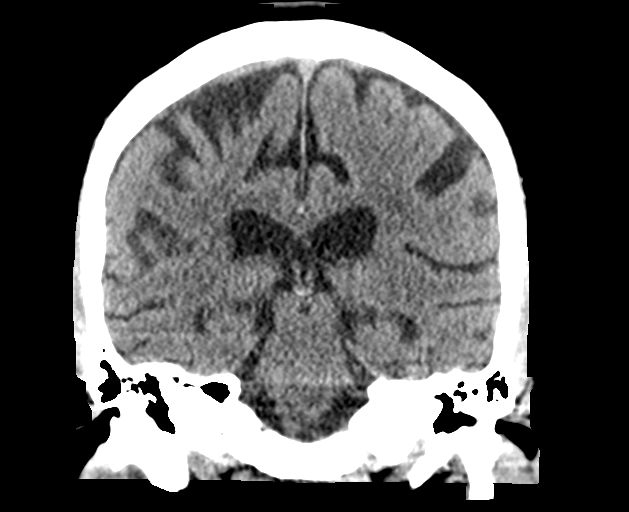

[Series 5: sagittal soft · sagittal · 0.35mm/px · 3 of 57 slices shown]
[im 19/57  brain]
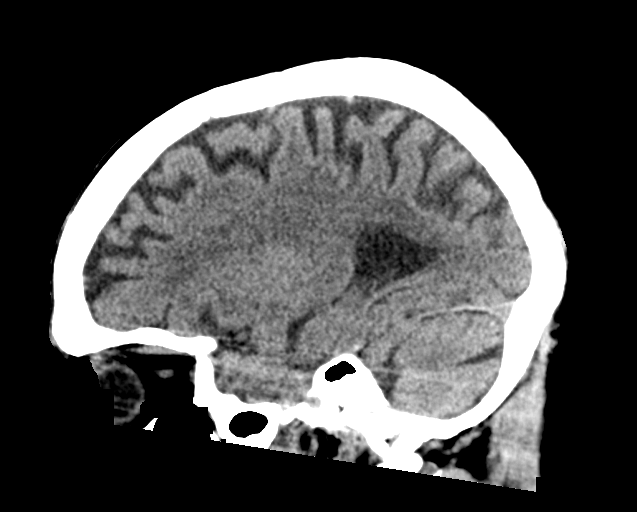
[im 29/57  brain]
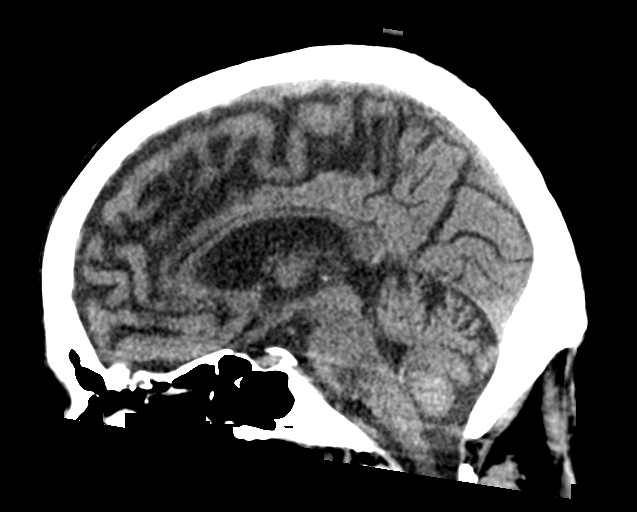
[im 38/57  brain]
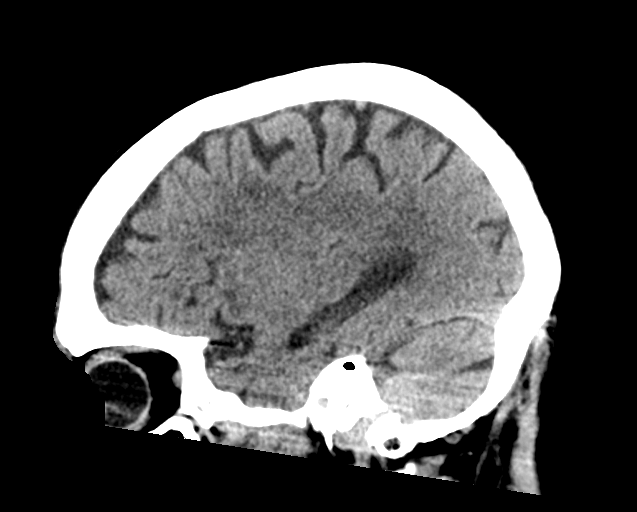

[16 of 47 positions shown; findings below may reference images not displayed]

FINDINGS: Brain: No evidence of acute infarction, hemorrhage, hydrocephalus,
extra-axial collection or mass lesion/mass effect. Mild generalized
atrophy. Mild chronic small vessel ischemia. Remote lacunar infarct
in right basal ganglia is unchanged from prior exam.

Vascular: No hyperdense vessel or unexpected calcification.

Skull: No fracture or focal lesion.

Sinuses/Orbits: Paranasal sinuses and mastoid air cells are clear.
The visualized orbits are unremarkable.

Other: Chronic right parietal scalp scarring.
IMPRESSION: 1. No acute intracranial abnormality.
2. Mild generalized atrophy and chronic small vessel ischemia,
slightly advanced for age.

## 2020-07-09 IMAGING — US US ABDOMEN LIMITED
1 series · 14 of 25 positions shown · non-contrast
Comparison: None

Correlation CT chest 12/18/2018

CLINICAL DATA: Abnormal LFTs

EXAM:
ULTRASOUND ABDOMEN LIMITED RIGHT UPPER QUADRANT

[Series 1: us abdomen limited · 0.20mm/px · 14 of 78 slices shown]
[im 1/78]
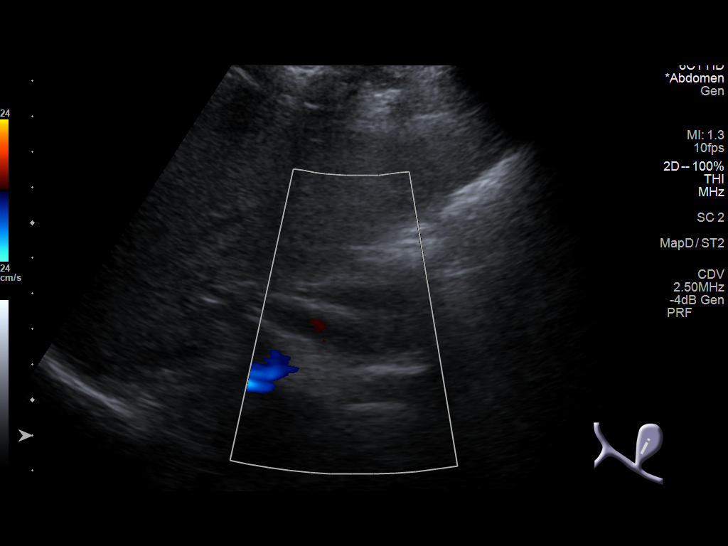
[im 7/78]
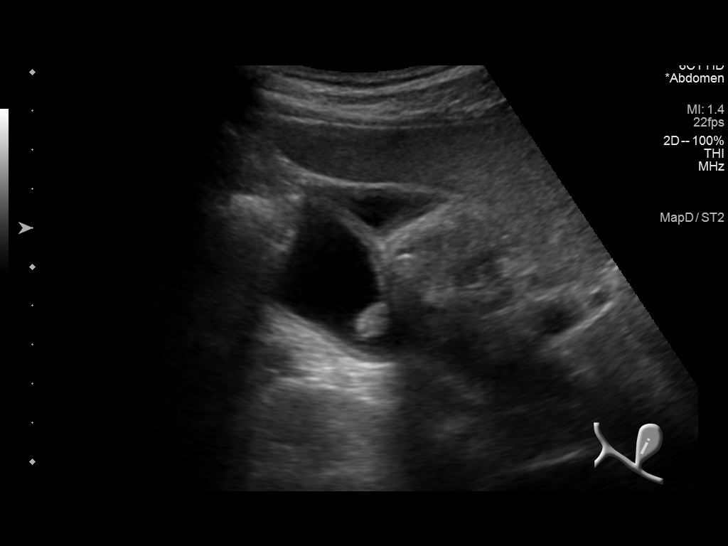
[im 13/78]
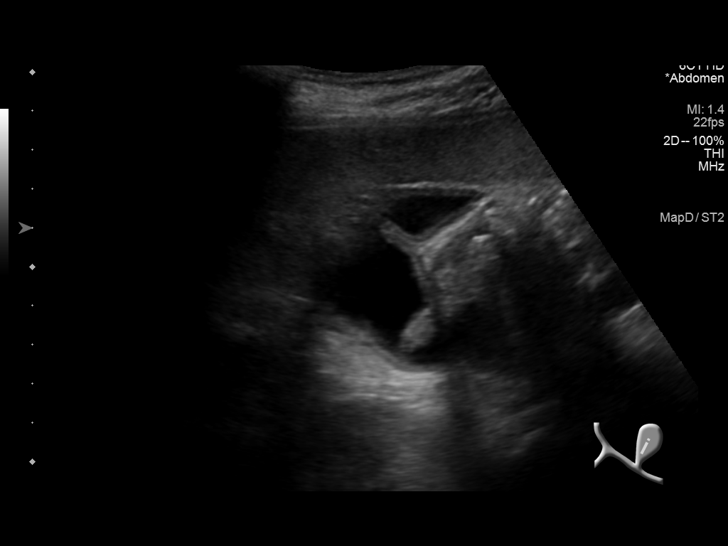
[im 20/78]
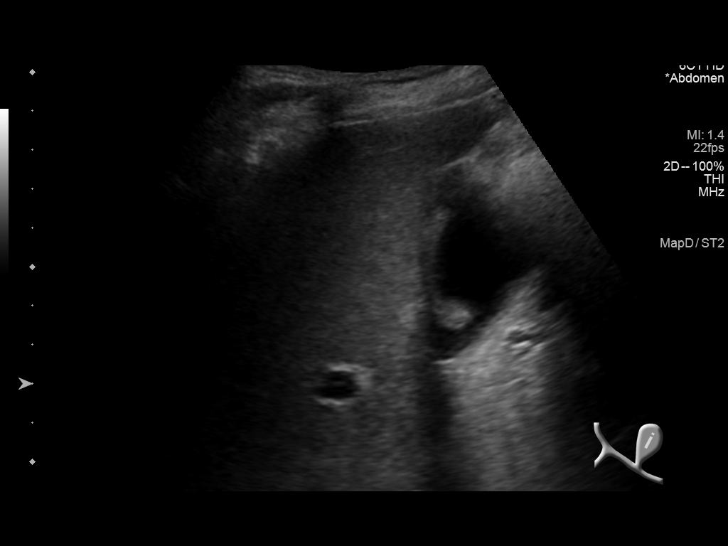
[im 26/78]
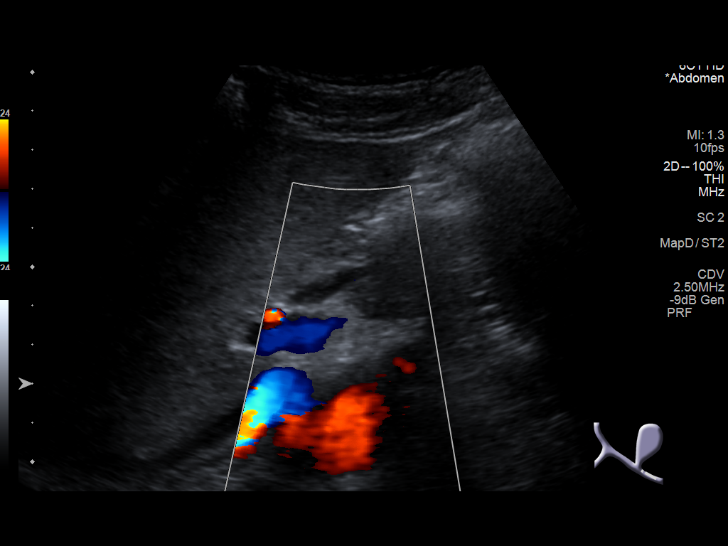
[im 29/78]
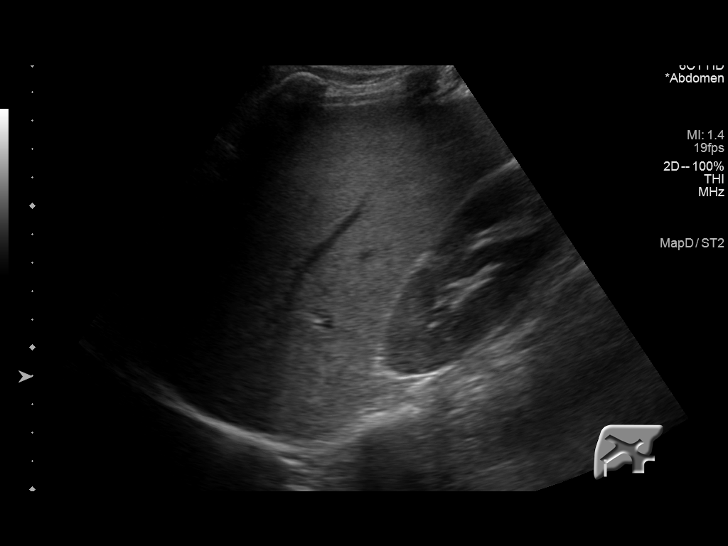
[im 36/78]
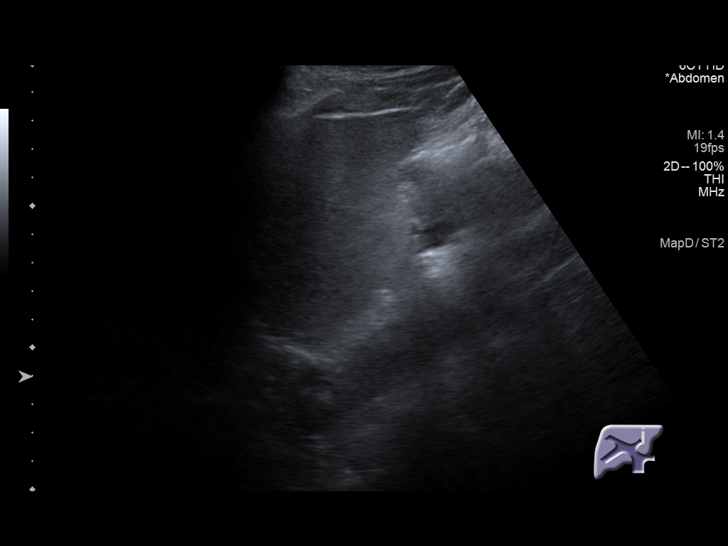
[im 42/78]
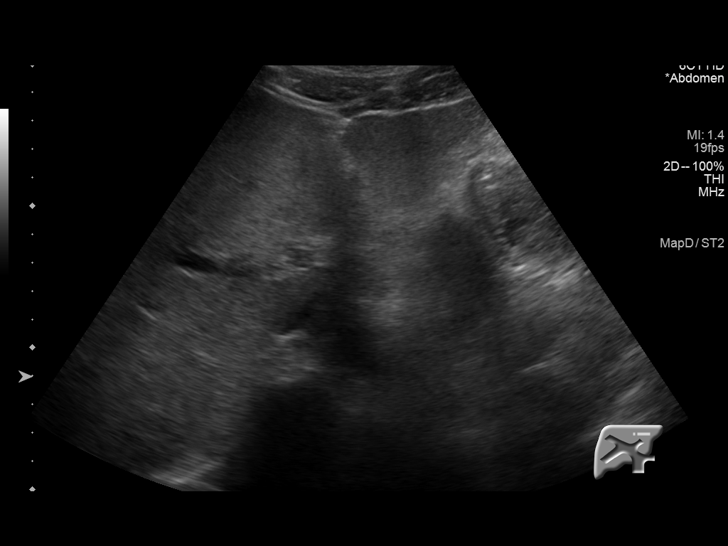
[im 49/78]
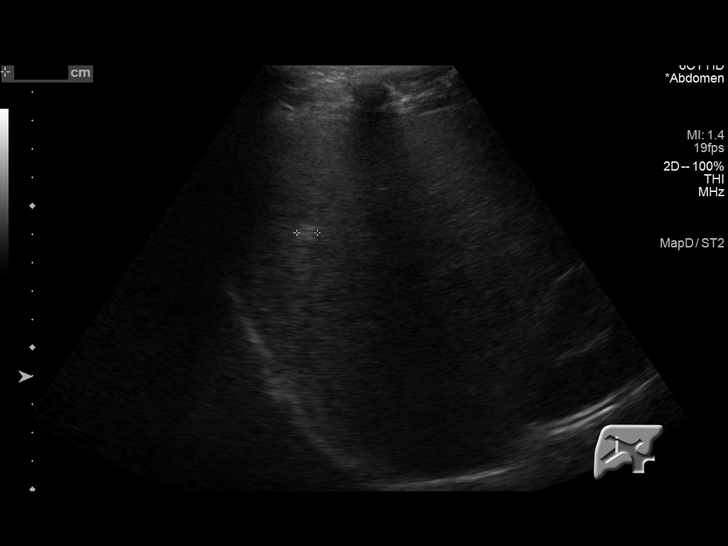
[im 52/78]
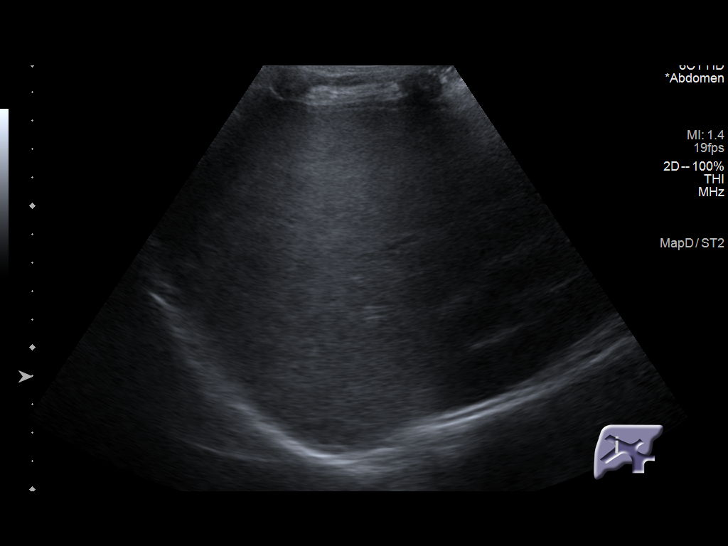
[im 58/78]
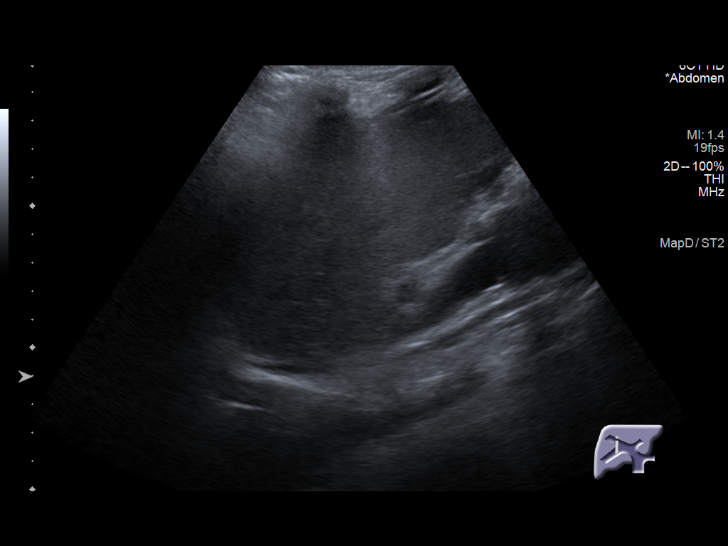
[im 65/78]
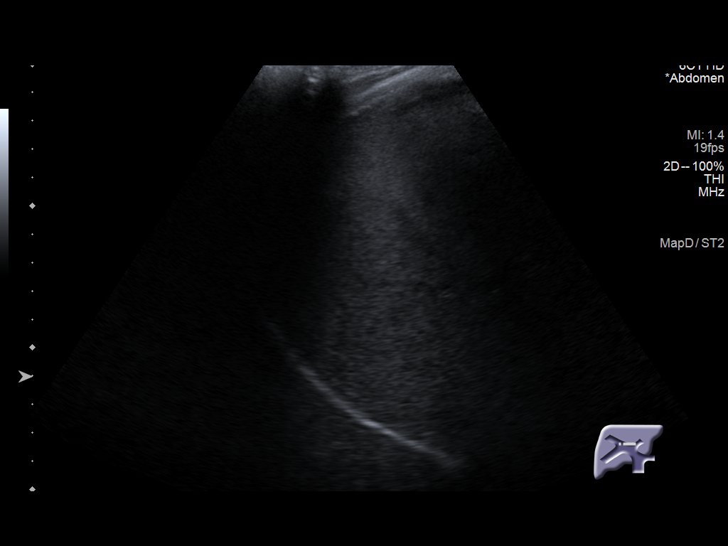
[im 71/78]
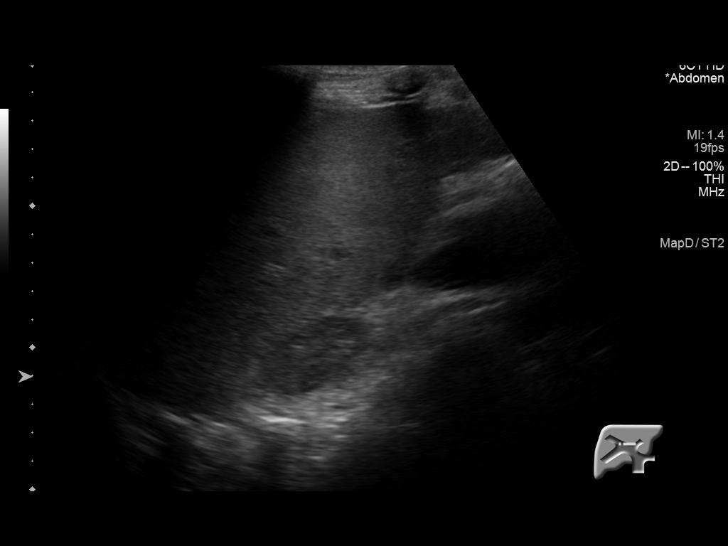
[im 78/78]
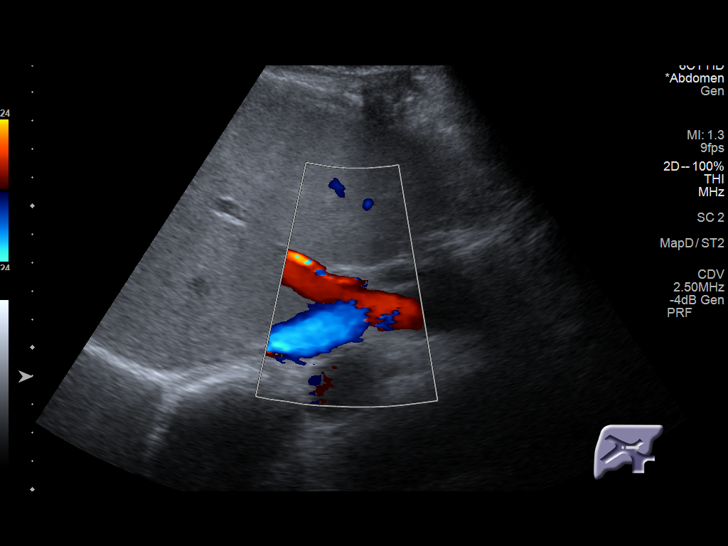

[14 of 25 positions shown; findings below may reference images not displayed]

FINDINGS: Gallbladder:

Incompletely distended. Gallbladder wall appears thickened. No
shadowing calculi or pericholecystic fluid. No sonographic Murphy
sign.

Common bile duct:

Diameter: 6 mm, upper normal

Liver:

Echogenic parenchyma, likely fatty infiltration though this can be
seen with cirrhosis and certain infiltrative disorders. 7 x 6 x 11
mm hyperechoic focus within RIGHT lobe of liver without shadowing
nonspecific but question tiny hemangioma. This is questionably
visualized as a hypoechoic nodule on the CT chest exam series 2,
image 120. Portal vein is patent on color Doppler imaging with
normal direction of blood flow towards the liver.

Other: No RIGHT upper quadrant free fluid.
IMPRESSION: Nonspecific mild wall thickening of the gallbladder without evidence
of gallstones or pericholecystic fluid.

Probable mild fatty infiltration of liver.

Question 11 mm hemangioma RIGHT lobe liver, questionably visualized
as slightly low-attenuation nodule on CT.

## 2020-07-09 IMAGING — DX DG CHEST 2V
2 series · 3 of 3 positions shown · non-contrast
Comparison: None.

CLINICAL DATA: Altered level of consciousness.

EXAM:
CHEST - 2 VIEW

[Series 2: chest lat · 0.14mm/px · 2 of 2 slices shown]
[im 1/2]
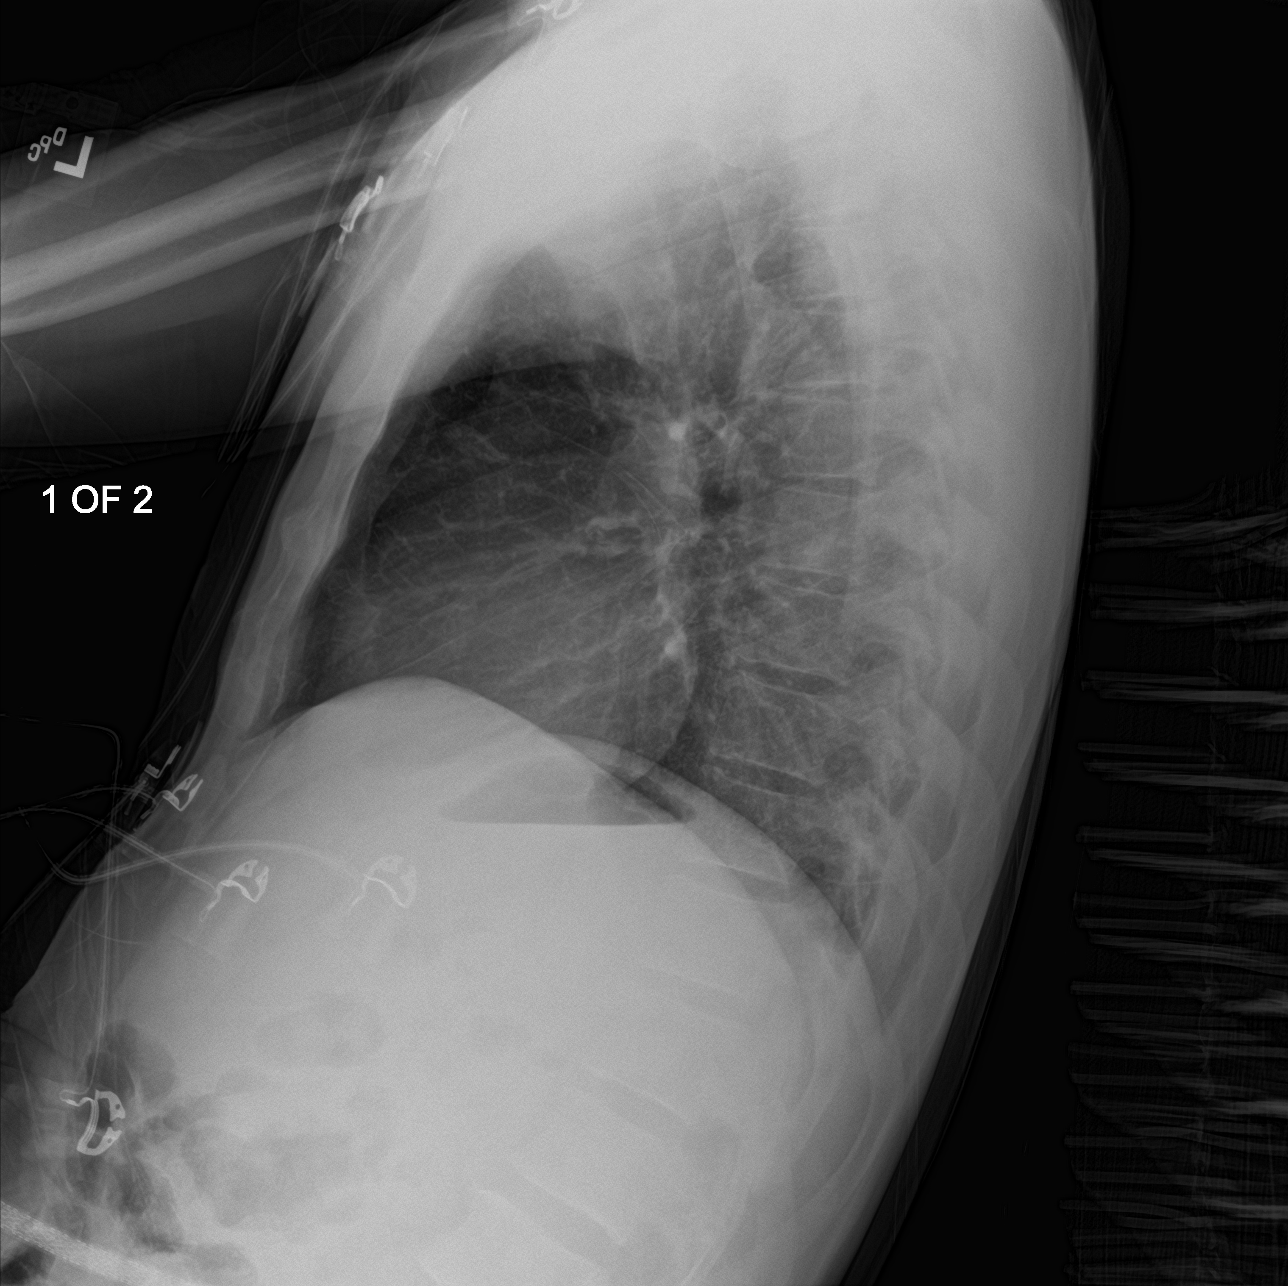
[im 2/2]
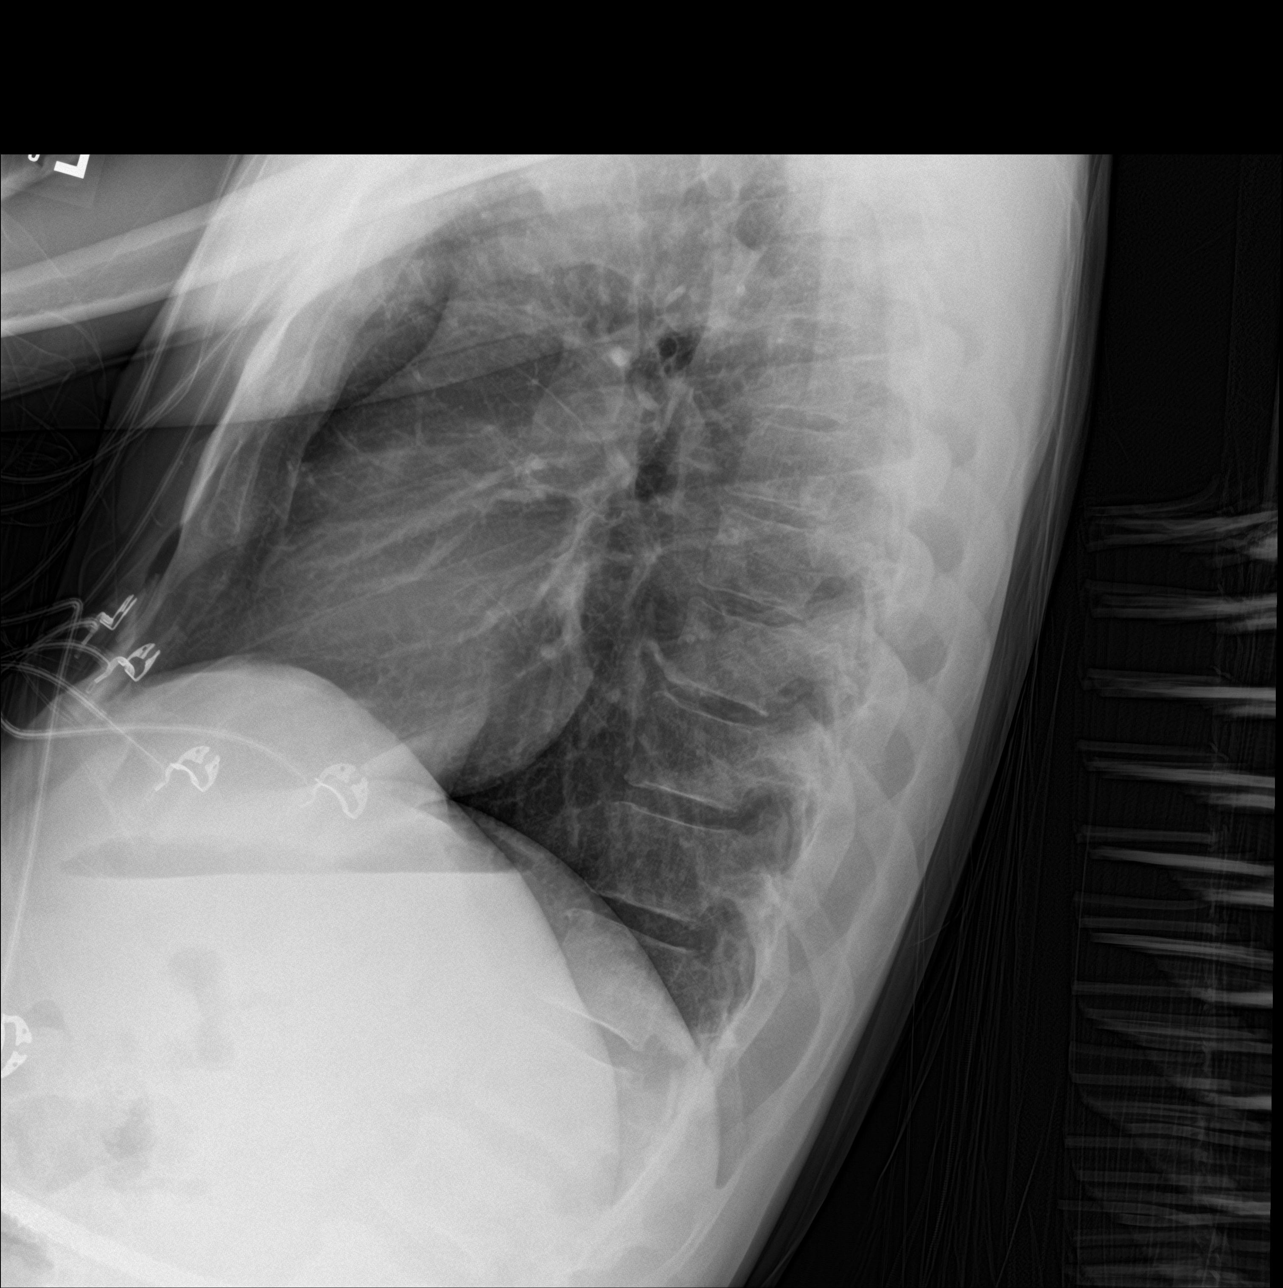

[chest ap]
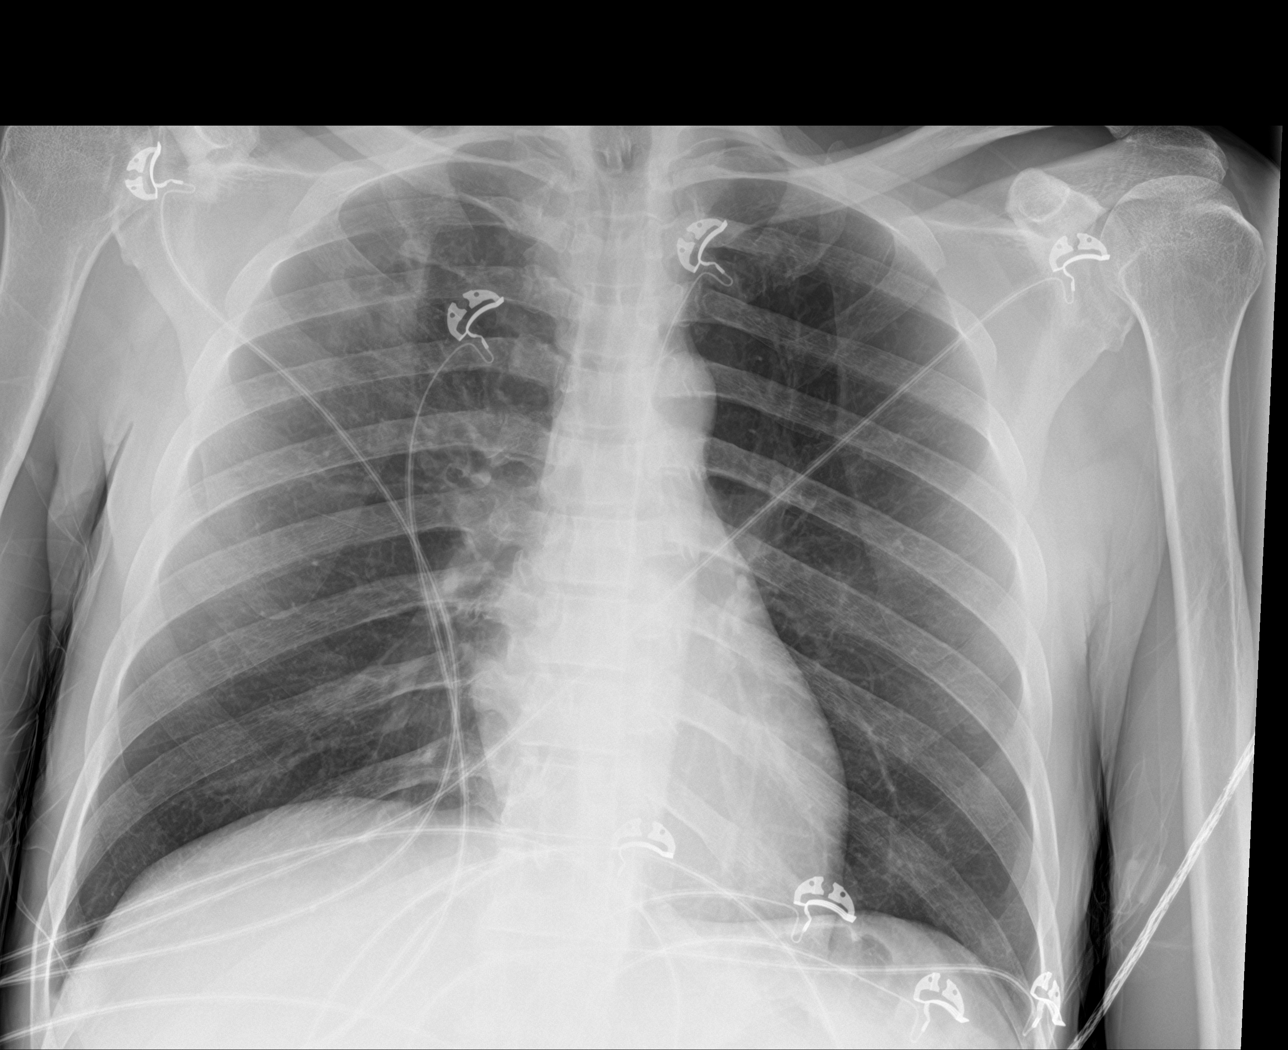

[3 of 3 positions shown; findings below may reference images not displayed]

FINDINGS: Right upper lobe airspace opacity with central lucencies. Left lung
is clear. Heart is normal in size. Slight right hilar prominence. No
pulmonary edema, pleural fluid, or pneumothorax. No acute osseous
abnormalities are seen.
IMPRESSION: Right upper lobe opacity with central lucencies, probable cavitary
process, infection versus malignancy. Recommend further evaluation
with chest CT, preferably with IV contrast.

## 2021-01-02 ENCOUNTER — Encounter: Payer: Self-pay | Admitting: *Deleted

## 2021-01-05 ENCOUNTER — Ambulatory Visit: Payer: Self-pay | Admitting: Urology

## 2021-01-19 ENCOUNTER — Encounter: Payer: Self-pay | Admitting: *Deleted

## 2021-05-15 ENCOUNTER — Ambulatory Visit (INDEPENDENT_AMBULATORY_CARE_PROVIDER_SITE_OTHER): Payer: Medicaid Other | Admitting: Urology

## 2021-05-15 ENCOUNTER — Other Ambulatory Visit: Payer: Self-pay

## 2021-05-15 ENCOUNTER — Encounter: Payer: Self-pay | Admitting: Urology

## 2021-05-15 VITALS — BP 126/84 | HR 105 | Ht 68.0 in | Wt 148.0 lb

## 2021-05-15 DIAGNOSIS — R8290 Unspecified abnormal findings in urine: Secondary | ICD-10-CM

## 2021-05-15 DIAGNOSIS — R972 Elevated prostate specific antigen [PSA]: Secondary | ICD-10-CM | POA: Diagnosis not present

## 2021-05-15 DIAGNOSIS — N401 Enlarged prostate with lower urinary tract symptoms: Secondary | ICD-10-CM | POA: Diagnosis not present

## 2021-05-15 DIAGNOSIS — N138 Other obstructive and reflux uropathy: Secondary | ICD-10-CM | POA: Diagnosis not present

## 2021-05-15 LAB — MICROSCOPIC EXAMINATION: Renal Epithel, UA: NONE SEEN /hpf

## 2021-05-15 LAB — URINALYSIS, ROUTINE W REFLEX MICROSCOPIC
Bilirubin, UA: NEGATIVE
Glucose, UA: NEGATIVE
Ketones, UA: NEGATIVE
Nitrite, UA: NEGATIVE
Protein,UA: NEGATIVE
Specific Gravity, UA: 1.015 (ref 1.005–1.030)
Urobilinogen, Ur: 1 mg/dL (ref 0.2–1.0)
pH, UA: 6 (ref 5.0–7.5)

## 2021-05-15 LAB — BLADDER SCAN AMB NON-IMAGING: Scan Result: 5

## 2021-05-15 MED ORDER — ALFUZOSIN HCL ER 10 MG PO TB24: 10.0000 mg | ORAL_TABLET | Freq: Every day | ORAL | 11 refills | Status: AC

## 2021-05-15 NOTE — Progress Notes (Signed)
Assessment: 1. Elevated PSA   2. Abnormal urine findings   3. BPH with obstruction/lower urinary tract symptoms     Plan: Urine culture sent today We will need repeat PSA.  We will wait to determine if UTI is present.  If so, we will need to wait approximately 2-3 months before rechecking the PSA level. Trial of alfuzosin 10 mg daily.  Rx sent. Use and side effects discussed.  Chief Complaint:  Chief Complaint  Patient presents with   Elevated PSA    History of Present Illness:  Jared Pope is a 56 y.o. year old male who is seen in consultation from Brent General, MD for evaluation of elevated PSA. PSA from 9/22 was 5.1.  No prior PSA results available for review.  The patient is not aware of prior elevated PSA levels.  No prior biopsy.  No history of prostatitis or UTIs.  He has symptoms of frequency, urgency, nocturia, hesitancy, and decreased force of stream.  No dysuria or gross hematuria. IPSS = 18 today.  Past Medical History:  Past Medical History:  Diagnosis Date   Anxiety    GERD (gastroesophageal reflux disease)    Hypertension    Seizure ([place])     Past Surgical History:  No past surgical history on file.  Allergies:  No Known Allergies  Family History:  Family History  Family history unknown: Yes    Social History:  Social History   Tobacco Use   Smoking status: Never   Smokeless tobacco: Never  Vaping Use   Vaping Use: Never used  Substance Use Topics   Alcohol use: Not Currently   Drug use: Not Currently    Review of symptoms:  Constitutional:  Negative for unexplained weight loss, night sweats, fever, chills ENT:  Negative for nose bleeds, sinus pain, painful swallowing CV:  Negative for chest pain, shortness of breath, exercise intolerance, palpitations, loss of consciousness Resp:  Negative for cough, wheezing, shortness of breath GI:  Negative for nausea, vomiting, diarrhea, bloody stools GU:  Positives noted in HPI; otherwise  negative for gross hematuria, dysuria, urinary incontinence Neuro:  Negative for seizures, poor balance, limb weakness, slurred speech Psych:  Negative for lack of energy, depression, anxiety Endocrine:  Negative for polydipsia, polyuria, symptoms of hypoglycemia (dizziness, hunger, sweating) Hematologic:  Negative for anemia, purpura, petechia, prolonged or excessive bleeding, use of anticoagulants  Allergic:  Negative for difficulty breathing or choking as a result of exposure to anything; no shellfish allergy; no allergic response (rash/itch) to materials, foods  Physical exam: BP 126/84    Pulse (!) 105    Ht 5\' 8"  (1.727 m)    Wt 148 lb (67.1 kg)    BMI 22.50 kg/m  GENERAL APPEARANCE:  Well appearing, well developed, well nourished, NAD HEENT: Atraumatic, Normocephalic, oropharynx clear. NECK: Supple without lymphadenopathy or thyromegaly. LUNGS: Clear to auscultation bilaterally. HEART: Regular Rate and Rhythm without murmurs, gallops, or rubs. ABDOMEN: Soft, non-tender, No Masses. EXTREMITIES: Moves all extremities well.  Without clubbing, cyanosis, or edema. NEUROLOGIC:  Alert and oriented x 3, normal gait, CN II-XII grossly intact.  MENTAL STATUS:  Appropriate. BACK:  Non-tender to palpation.  No CVAT SKIN:  Warm, dry and intact. GU: Penis:  circumcised Meatus: Normal Scrotum: normal, no masses Testis: normal without masses bilateral Epididymis: normal Prostate: 40 g, slightly tender, no nodules Rectum: Normal tone,  no masses or tenderness     Results: U/A:  11-30 WBC, 0-2 RBC, many bacteria  PVR:  5 ml

## 2021-05-18 ENCOUNTER — Telehealth: Payer: Self-pay

## 2021-05-18 LAB — URINE CULTURE

## 2021-05-18 MED ORDER — SULFAMETHOXAZOLE-TRIMETHOPRIM 800-160 MG PO TABS: 1.0000 | ORAL_TABLET | Freq: Two times a day (BID) | ORAL | 0 refills | Status: AC

## 2021-05-18 NOTE — Telephone Encounter (Signed)
-----   Message from Milderd Meager, MD sent at 05/18/2021  9:54 AM EST ----- Please notify the patient that his urine culture did show evidence of UTI.  I have sent in a prescription for Bactrim DS for 1 week for treatment. Please have him schedule a follow-up appointment in 3-4 weeks.

## 2021-05-18 NOTE — Addendum Note (Signed)
Addended by: Milderd Meager on: 05/18/2021 09:54 AM   Modules accepted: Orders

## 2021-05-18 NOTE — Telephone Encounter (Signed)
(  Niece) Vena Rua called and made aware. Appointment made and mailed to patient.

## 2021-06-09 ENCOUNTER — Ambulatory Visit: Payer: Medicaid Other | Admitting: Urology

## 2021-06-09 NOTE — Progress Notes (Deleted)
? ?  Assessment: ?1. Elevated PSA   ?2. BPH with obstruction/lower urinary tract symptoms   ?3. History of UTI   ? ? ?Plan: ?Needs repeat PSA but given recent documented UTI, we will need to wait approximately 2-3 months before rechecking the PSA level. ?Continue alfuzosin 10 mg daily.  ? ?Chief Complaint:  ?No chief complaint on file. ? ? ?History of Present Illness: ? ?Jared Pope is a 56 y.o. year old male who is seen for further evaluation of an elevated PSA and UTI. ?PSA from 9/22 was 5.1.  No prior PSA results available for review.  No prior elevated PSA levels by history.  No prior biopsy.  No history of prostatitis or UTIs.  He had symptoms of frequency, urgency, nocturia, hesitancy, and decreased force of stream.  No dysuria or gross hematuria. ?IPSS = 18.  PVR = 5 ml. ?Urine culture from 05/15/21 grew >100K E. Coli, pan-sensitive.  Treated with Bactrim DS. ?He was started on alfuzosin in February 2023. ? ? ?Portions of the above documentation were copied from a prior visit for review purposes only. ? ? ?Past Medical History:  ?Past Medical History:  ?Diagnosis Date  ? Anxiety   ? GERD (gastroesophageal reflux disease)   ? Hypertension   ? Seizure ([place])   ? ? ?Past Surgical History:  ?No past surgical history on file. ? ?Allergies:  ?No Known Allergies ? ?Family History:  ?Family History  ?Family history unknown: Yes  ? ? ?Social History:  ?Social History  ? ?Tobacco Use  ? Smoking status: Never  ? Smokeless tobacco: Never  ?Vaping Use  ? Vaping Use: Never used  ?Substance Use Topics  ? Alcohol use: Not Currently  ? Drug use: Not Currently  ? ? ?ROS: ?Constitutional:  Negative for fever, chills, weight loss ?CV: Negative for chest pain, previous MI, hypertension ?Respiratory:  Negative for shortness of breath, wheezing, sleep apnea, frequent cough ?GI:  Negative for nausea, vomiting, bloody stool, GERD ? ?Physical exam: ?There were no vitals taken for this visit. ?GENERAL APPEARANCE:  Well appearing,  well developed, well nourished, NAD ?HEENT:  Atraumatic, normocephalic, oropharynx clear ?NECK:  Supple without lymphadenopathy or thyromegaly ?ABDOMEN:  Soft, non-tender, no masses ?EXTREMITIES:  Moves all extremities well, without clubbing, cyanosis, or edema ?NEUROLOGIC:  Alert and oriented x 3, normal gait, CN II-XII grossly intact ?MENTAL STATUS:  appropriate ?BACK:  Non-tender to palpation, No CVAT ?SKIN:  Warm, dry, and intact ?   ? ?Results: ?U/A:   ?

## 2022-08-27 DIAGNOSIS — R404 Transient alteration of awareness: Secondary | ICD-10-CM | POA: Diagnosis not present

## 2022-08-27 DIAGNOSIS — R451 Restlessness and agitation: Secondary | ICD-10-CM | POA: Diagnosis not present

## 2022-08-27 DIAGNOSIS — N179 Acute kidney failure, unspecified: Secondary | ICD-10-CM | POA: Diagnosis not present

## 2022-08-27 DIAGNOSIS — R6889 Other general symptoms and signs: Secondary | ICD-10-CM | POA: Diagnosis not present

## 2022-08-27 DIAGNOSIS — R000 Tachycardia, unspecified: Secondary | ICD-10-CM | POA: Diagnosis not present

## 2022-08-27 DIAGNOSIS — S2231XA Fracture of one rib, right side, initial encounter for closed fracture: Secondary | ICD-10-CM | POA: Diagnosis not present

## 2022-08-27 DIAGNOSIS — R4182 Altered mental status, unspecified: Secondary | ICD-10-CM | POA: Diagnosis not present

## 2022-08-27 DIAGNOSIS — Z743 Need for continuous supervision: Secondary | ICD-10-CM | POA: Diagnosis not present

## 2022-08-27 DIAGNOSIS — I10 Essential (primary) hypertension: Secondary | ICD-10-CM | POA: Diagnosis not present

## 2022-08-27 DIAGNOSIS — N3001 Acute cystitis with hematuria: Secondary | ICD-10-CM | POA: Diagnosis not present

## 2022-08-27 DIAGNOSIS — N390 Urinary tract infection, site not specified: Secondary | ICD-10-CM | POA: Diagnosis not present

## 2022-08-27 DIAGNOSIS — I160 Hypertensive urgency: Secondary | ICD-10-CM | POA: Diagnosis not present

## 2022-08-27 DIAGNOSIS — M6282 Rhabdomyolysis: Secondary | ICD-10-CM | POA: Diagnosis not present

## 2022-08-28 DIAGNOSIS — R4182 Altered mental status, unspecified: Secondary | ICD-10-CM | POA: Diagnosis not present

## 2022-08-29 DIAGNOSIS — R4182 Altered mental status, unspecified: Secondary | ICD-10-CM | POA: Diagnosis not present

## 2022-08-29 DIAGNOSIS — I10 Essential (primary) hypertension: Secondary | ICD-10-CM | POA: Diagnosis not present

## 2022-08-29 DIAGNOSIS — F1410 Cocaine abuse, uncomplicated: Secondary | ICD-10-CM | POA: Diagnosis not present

## 2022-08-29 DIAGNOSIS — Z8659 Personal history of other mental and behavioral disorders: Secondary | ICD-10-CM | POA: Diagnosis not present

## 2022-09-02 DIAGNOSIS — F10239 Alcohol dependence with withdrawal, unspecified: Secondary | ICD-10-CM | POA: Diagnosis not present

## 2022-09-02 DIAGNOSIS — N179 Acute kidney failure, unspecified: Secondary | ICD-10-CM | POA: Diagnosis not present

## 2022-09-02 DIAGNOSIS — R4182 Altered mental status, unspecified: Secondary | ICD-10-CM | POA: Diagnosis not present

## 2022-09-02 DIAGNOSIS — I10 Essential (primary) hypertension: Secondary | ICD-10-CM | POA: Diagnosis not present

## 2022-09-03 DIAGNOSIS — R4182 Altered mental status, unspecified: Secondary | ICD-10-CM | POA: Diagnosis not present

## 2022-09-03 DIAGNOSIS — I10 Essential (primary) hypertension: Secondary | ICD-10-CM | POA: Diagnosis not present

## 2022-09-03 DIAGNOSIS — T68XXXD Hypothermia, subsequent encounter: Secondary | ICD-10-CM | POA: Diagnosis not present

## 2022-09-03 DIAGNOSIS — R188 Other ascites: Secondary | ICD-10-CM | POA: Diagnosis not present

## 2022-09-03 DIAGNOSIS — F10231 Alcohol dependence with withdrawal delirium: Secondary | ICD-10-CM | POA: Diagnosis not present

## 2022-09-04 DIAGNOSIS — R109 Unspecified abdominal pain: Secondary | ICD-10-CM | POA: Diagnosis not present

## 2022-09-04 DIAGNOSIS — J90 Pleural effusion, not elsewhere classified: Secondary | ICD-10-CM | POA: Diagnosis not present

## 2022-09-04 DIAGNOSIS — I10 Essential (primary) hypertension: Secondary | ICD-10-CM | POA: Diagnosis not present

## 2022-09-04 DIAGNOSIS — E876 Hypokalemia: Secondary | ICD-10-CM | POA: Diagnosis not present

## 2022-09-04 DIAGNOSIS — G928 Other toxic encephalopathy: Secondary | ICD-10-CM | POA: Diagnosis not present

## 2022-09-04 DIAGNOSIS — R410 Disorientation, unspecified: Secondary | ICD-10-CM | POA: Diagnosis not present

## 2022-09-05 DIAGNOSIS — I10 Essential (primary) hypertension: Secondary | ICD-10-CM | POA: Diagnosis not present

## 2022-09-05 DIAGNOSIS — R456 Violent behavior: Secondary | ICD-10-CM | POA: Diagnosis not present

## 2022-09-05 DIAGNOSIS — R410 Disorientation, unspecified: Secondary | ICD-10-CM | POA: Diagnosis not present

## 2022-09-05 DIAGNOSIS — G929 Unspecified toxic encephalopathy: Secondary | ICD-10-CM | POA: Diagnosis not present

## 2022-12-09 DIAGNOSIS — Z20822 Contact with and (suspected) exposure to covid-19: Secondary | ICD-10-CM | POA: Diagnosis not present

## 2022-12-09 DIAGNOSIS — G9341 Metabolic encephalopathy: Secondary | ICD-10-CM | POA: Diagnosis not present

## 2022-12-09 DIAGNOSIS — Z043 Encounter for examination and observation following other accident: Secondary | ICD-10-CM | POA: Diagnosis not present

## 2022-12-09 DIAGNOSIS — N179 Acute kidney failure, unspecified: Secondary | ICD-10-CM | POA: Diagnosis not present

## 2022-12-09 DIAGNOSIS — N183 Chronic kidney disease, stage 3 unspecified: Secondary | ICD-10-CM | POA: Diagnosis not present

## 2022-12-09 DIAGNOSIS — G319 Degenerative disease of nervous system, unspecified: Secondary | ICD-10-CM | POA: Diagnosis not present

## 2022-12-09 DIAGNOSIS — R569 Unspecified convulsions: Secondary | ICD-10-CM | POA: Diagnosis not present

## 2022-12-09 DIAGNOSIS — R404 Transient alteration of awareness: Secondary | ICD-10-CM | POA: Diagnosis not present

## 2022-12-09 DIAGNOSIS — E871 Hypo-osmolality and hyponatremia: Secondary | ICD-10-CM | POA: Diagnosis not present

## 2022-12-09 DIAGNOSIS — R4182 Altered mental status, unspecified: Secondary | ICD-10-CM | POA: Diagnosis not present

## 2022-12-10 DIAGNOSIS — Z8659 Personal history of other mental and behavioral disorders: Secondary | ICD-10-CM | POA: Diagnosis not present

## 2022-12-10 DIAGNOSIS — N183 Chronic kidney disease, stage 3 unspecified: Secondary | ICD-10-CM | POA: Diagnosis not present

## 2022-12-10 DIAGNOSIS — R945 Abnormal results of liver function studies: Secondary | ICD-10-CM | POA: Diagnosis not present

## 2022-12-10 DIAGNOSIS — E871 Hypo-osmolality and hyponatremia: Secondary | ICD-10-CM | POA: Diagnosis not present

## 2022-12-10 DIAGNOSIS — G9341 Metabolic encephalopathy: Secondary | ICD-10-CM | POA: Diagnosis not present

## 2022-12-10 DIAGNOSIS — N2889 Other specified disorders of kidney and ureter: Secondary | ICD-10-CM | POA: Diagnosis not present

## 2022-12-10 DIAGNOSIS — R7989 Other specified abnormal findings of blood chemistry: Secondary | ICD-10-CM | POA: Diagnosis not present

## 2022-12-10 DIAGNOSIS — E872 Acidosis, unspecified: Secondary | ICD-10-CM | POA: Diagnosis not present

## 2022-12-11 DIAGNOSIS — N179 Acute kidney failure, unspecified: Secondary | ICD-10-CM | POA: Diagnosis not present

## 2022-12-11 DIAGNOSIS — R7989 Other specified abnormal findings of blood chemistry: Secondary | ICD-10-CM | POA: Diagnosis not present

## 2022-12-11 DIAGNOSIS — F10239 Alcohol dependence with withdrawal, unspecified: Secondary | ICD-10-CM | POA: Diagnosis not present

## 2022-12-11 DIAGNOSIS — E871 Hypo-osmolality and hyponatremia: Secondary | ICD-10-CM | POA: Diagnosis not present

## 2022-12-11 DIAGNOSIS — Z79899 Other long term (current) drug therapy: Secondary | ICD-10-CM | POA: Diagnosis not present

## 2022-12-11 DIAGNOSIS — N183 Chronic kidney disease, stage 3 unspecified: Secondary | ICD-10-CM | POA: Diagnosis not present

## 2022-12-11 DIAGNOSIS — G9341 Metabolic encephalopathy: Secondary | ICD-10-CM | POA: Diagnosis not present

## 2022-12-12 DIAGNOSIS — F10239 Alcohol dependence with withdrawal, unspecified: Secondary | ICD-10-CM | POA: Diagnosis not present

## 2022-12-12 DIAGNOSIS — E871 Hypo-osmolality and hyponatremia: Secondary | ICD-10-CM | POA: Diagnosis not present

## 2022-12-12 DIAGNOSIS — N183 Chronic kidney disease, stage 3 unspecified: Secondary | ICD-10-CM | POA: Diagnosis not present

## 2022-12-12 DIAGNOSIS — N179 Acute kidney failure, unspecified: Secondary | ICD-10-CM | POA: Diagnosis not present

## 2022-12-12 DIAGNOSIS — R7989 Other specified abnormal findings of blood chemistry: Secondary | ICD-10-CM | POA: Diagnosis not present

## 2022-12-12 DIAGNOSIS — G9341 Metabolic encephalopathy: Secondary | ICD-10-CM | POA: Diagnosis not present

## 2022-12-13 DIAGNOSIS — N183 Chronic kidney disease, stage 3 unspecified: Secondary | ICD-10-CM | POA: Diagnosis not present

## 2022-12-13 DIAGNOSIS — N179 Acute kidney failure, unspecified: Secondary | ICD-10-CM | POA: Diagnosis not present

## 2022-12-13 DIAGNOSIS — R7989 Other specified abnormal findings of blood chemistry: Secondary | ICD-10-CM | POA: Diagnosis not present

## 2022-12-13 DIAGNOSIS — E871 Hypo-osmolality and hyponatremia: Secondary | ICD-10-CM | POA: Diagnosis not present

## 2022-12-13 DIAGNOSIS — G9341 Metabolic encephalopathy: Secondary | ICD-10-CM | POA: Diagnosis not present

## 2022-12-13 DIAGNOSIS — E440 Moderate protein-calorie malnutrition: Secondary | ICD-10-CM | POA: Diagnosis not present

## 2022-12-13 DIAGNOSIS — F10139 Alcohol abuse with withdrawal, unspecified: Secondary | ICD-10-CM | POA: Diagnosis not present

## 2022-12-13 DIAGNOSIS — Z79899 Other long term (current) drug therapy: Secondary | ICD-10-CM | POA: Diagnosis not present

## 2022-12-14 DIAGNOSIS — Z79899 Other long term (current) drug therapy: Secondary | ICD-10-CM | POA: Diagnosis not present

## 2022-12-14 DIAGNOSIS — R7989 Other specified abnormal findings of blood chemistry: Secondary | ICD-10-CM | POA: Diagnosis not present

## 2022-12-14 DIAGNOSIS — F10239 Alcohol dependence with withdrawal, unspecified: Secondary | ICD-10-CM | POA: Diagnosis not present

## 2022-12-14 DIAGNOSIS — N183 Chronic kidney disease, stage 3 unspecified: Secondary | ICD-10-CM | POA: Diagnosis not present

## 2022-12-14 DIAGNOSIS — Z043 Encounter for examination and observation following other accident: Secondary | ICD-10-CM | POA: Diagnosis not present

## 2022-12-14 DIAGNOSIS — N179 Acute kidney failure, unspecified: Secondary | ICD-10-CM | POA: Diagnosis not present

## 2022-12-14 DIAGNOSIS — D62 Acute posthemorrhagic anemia: Secondary | ICD-10-CM | POA: Diagnosis not present

## 2022-12-14 DIAGNOSIS — M1611 Unilateral primary osteoarthritis, right hip: Secondary | ICD-10-CM | POA: Diagnosis not present

## 2022-12-14 DIAGNOSIS — G9341 Metabolic encephalopathy: Secondary | ICD-10-CM | POA: Diagnosis not present

## 2022-12-14 DIAGNOSIS — E871 Hypo-osmolality and hyponatremia: Secondary | ICD-10-CM | POA: Diagnosis not present

## 2022-12-15 DIAGNOSIS — D62 Acute posthemorrhagic anemia: Secondary | ICD-10-CM | POA: Diagnosis not present

## 2022-12-15 DIAGNOSIS — N183 Chronic kidney disease, stage 3 unspecified: Secondary | ICD-10-CM | POA: Diagnosis not present

## 2022-12-15 DIAGNOSIS — F10239 Alcohol dependence with withdrawal, unspecified: Secondary | ICD-10-CM | POA: Diagnosis not present

## 2022-12-15 DIAGNOSIS — R7989 Other specified abnormal findings of blood chemistry: Secondary | ICD-10-CM | POA: Diagnosis not present

## 2022-12-15 DIAGNOSIS — E871 Hypo-osmolality and hyponatremia: Secondary | ICD-10-CM | POA: Diagnosis not present

## 2022-12-15 DIAGNOSIS — G9341 Metabolic encephalopathy: Secondary | ICD-10-CM | POA: Diagnosis not present

## 2022-12-15 DIAGNOSIS — Z79899 Other long term (current) drug therapy: Secondary | ICD-10-CM | POA: Diagnosis not present

## 2022-12-15 DIAGNOSIS — N179 Acute kidney failure, unspecified: Secondary | ICD-10-CM | POA: Diagnosis not present

## 2022-12-16 DIAGNOSIS — N179 Acute kidney failure, unspecified: Secondary | ICD-10-CM | POA: Diagnosis not present

## 2022-12-16 DIAGNOSIS — Z79899 Other long term (current) drug therapy: Secondary | ICD-10-CM | POA: Diagnosis not present

## 2022-12-16 DIAGNOSIS — N183 Chronic kidney disease, stage 3 unspecified: Secondary | ICD-10-CM | POA: Diagnosis not present

## 2022-12-16 DIAGNOSIS — R7989 Other specified abnormal findings of blood chemistry: Secondary | ICD-10-CM | POA: Diagnosis not present

## 2022-12-16 DIAGNOSIS — F10239 Alcohol dependence with withdrawal, unspecified: Secondary | ICD-10-CM | POA: Diagnosis not present

## 2022-12-16 DIAGNOSIS — G9341 Metabolic encephalopathy: Secondary | ICD-10-CM | POA: Diagnosis not present

## 2022-12-16 DIAGNOSIS — D62 Acute posthemorrhagic anemia: Secondary | ICD-10-CM | POA: Diagnosis not present

## 2022-12-17 DIAGNOSIS — D62 Acute posthemorrhagic anemia: Secondary | ICD-10-CM | POA: Diagnosis not present

## 2022-12-17 DIAGNOSIS — N179 Acute kidney failure, unspecified: Secondary | ICD-10-CM | POA: Diagnosis not present

## 2022-12-17 DIAGNOSIS — F10239 Alcohol dependence with withdrawal, unspecified: Secondary | ICD-10-CM | POA: Diagnosis not present

## 2022-12-17 DIAGNOSIS — N183 Chronic kidney disease, stage 3 unspecified: Secondary | ICD-10-CM | POA: Diagnosis not present

## 2022-12-17 DIAGNOSIS — G9341 Metabolic encephalopathy: Secondary | ICD-10-CM | POA: Diagnosis not present

## 2022-12-17 DIAGNOSIS — E440 Moderate protein-calorie malnutrition: Secondary | ICD-10-CM | POA: Diagnosis not present

## 2022-12-17 DIAGNOSIS — R7989 Other specified abnormal findings of blood chemistry: Secondary | ICD-10-CM | POA: Diagnosis not present

## 2022-12-17 DIAGNOSIS — Z79899 Other long term (current) drug therapy: Secondary | ICD-10-CM | POA: Diagnosis not present

## 2022-12-18 DIAGNOSIS — G9341 Metabolic encephalopathy: Secondary | ICD-10-CM | POA: Diagnosis not present

## 2022-12-18 DIAGNOSIS — N183 Chronic kidney disease, stage 3 unspecified: Secondary | ICD-10-CM | POA: Diagnosis not present

## 2022-12-18 DIAGNOSIS — N179 Acute kidney failure, unspecified: Secondary | ICD-10-CM | POA: Diagnosis not present

## 2022-12-18 DIAGNOSIS — F10239 Alcohol dependence with withdrawal, unspecified: Secondary | ICD-10-CM | POA: Diagnosis not present

## 2022-12-18 DIAGNOSIS — D62 Acute posthemorrhagic anemia: Secondary | ICD-10-CM | POA: Diagnosis not present

## 2022-12-18 DIAGNOSIS — E440 Moderate protein-calorie malnutrition: Secondary | ICD-10-CM | POA: Diagnosis not present

## 2022-12-27 ENCOUNTER — Encounter (INDEPENDENT_AMBULATORY_CARE_PROVIDER_SITE_OTHER): Payer: Self-pay | Admitting: *Deleted

## 2023-03-01 DIAGNOSIS — R519 Headache, unspecified: Secondary | ICD-10-CM | POA: Diagnosis not present

## 2023-03-01 DIAGNOSIS — S301XXA Contusion of abdominal wall, initial encounter: Secondary | ICD-10-CM | POA: Diagnosis not present

## 2023-03-01 DIAGNOSIS — I10 Essential (primary) hypertension: Secondary | ICD-10-CM | POA: Diagnosis not present

## 2023-03-01 DIAGNOSIS — R569 Unspecified convulsions: Secondary | ICD-10-CM | POA: Diagnosis not present

## 2023-03-01 DIAGNOSIS — M542 Cervicalgia: Secondary | ICD-10-CM | POA: Diagnosis not present

## 2023-03-01 DIAGNOSIS — M25559 Pain in unspecified hip: Secondary | ICD-10-CM | POA: Diagnosis not present

## 2023-03-01 DIAGNOSIS — S42292A Other displaced fracture of upper end of left humerus, initial encounter for closed fracture: Secondary | ICD-10-CM | POA: Diagnosis not present

## 2023-03-01 DIAGNOSIS — R0781 Pleurodynia: Secondary | ICD-10-CM | POA: Diagnosis not present

## 2023-03-01 DIAGNOSIS — I700 Atherosclerosis of aorta: Secondary | ICD-10-CM | POA: Diagnosis not present

## 2023-03-01 DIAGNOSIS — S42302A Unspecified fracture of shaft of humerus, left arm, initial encounter for closed fracture: Secondary | ICD-10-CM | POA: Diagnosis not present

## 2023-03-01 DIAGNOSIS — F17210 Nicotine dependence, cigarettes, uncomplicated: Secondary | ICD-10-CM | POA: Diagnosis not present

## 2023-03-01 DIAGNOSIS — S20212A Contusion of left front wall of thorax, initial encounter: Secondary | ICD-10-CM | POA: Diagnosis not present

## 2023-03-01 DIAGNOSIS — Z79899 Other long term (current) drug therapy: Secondary | ICD-10-CM | POA: Diagnosis not present

## 2023-03-01 DIAGNOSIS — S299XXA Unspecified injury of thorax, initial encounter: Secondary | ICD-10-CM | POA: Diagnosis not present

## 2023-03-01 DIAGNOSIS — W132XXA Fall from, out of or through roof, initial encounter: Secondary | ICD-10-CM | POA: Diagnosis not present

## 2023-03-01 DIAGNOSIS — S4292XA Fracture of left shoulder girdle, part unspecified, initial encounter for closed fracture: Secondary | ICD-10-CM | POA: Diagnosis not present

## 2023-03-01 DIAGNOSIS — R0789 Other chest pain: Secondary | ICD-10-CM | POA: Diagnosis not present

## 2023-03-08 DIAGNOSIS — M79622 Pain in left upper arm: Secondary | ICD-10-CM | POA: Diagnosis not present

## 2023-03-08 DIAGNOSIS — M7989 Other specified soft tissue disorders: Secondary | ICD-10-CM | POA: Diagnosis not present

## 2023-03-08 DIAGNOSIS — R2232 Localized swelling, mass and lump, left upper limb: Secondary | ICD-10-CM | POA: Diagnosis not present

## 2023-03-08 DIAGNOSIS — S42292A Other displaced fracture of upper end of left humerus, initial encounter for closed fracture: Secondary | ICD-10-CM | POA: Diagnosis not present
